# Patient Record
Sex: Female | Born: 1990 | Race: Black or African American | Hispanic: No | Marital: Single | Smoking: Never smoker
Health system: Southern US, Community
[De-identification: ages and names within clinical notes are randomized; demographics above are authoritative.]

## PROBLEM LIST (undated history)

## (undated) DIAGNOSIS — M419 Scoliosis, unspecified: Secondary | ICD-10-CM

## (undated) DIAGNOSIS — B20 Human immunodeficiency virus [HIV] disease: Secondary | ICD-10-CM

## (undated) DIAGNOSIS — Z21 Asymptomatic human immunodeficiency virus [HIV] infection status: Secondary | ICD-10-CM

## (undated) HISTORY — PX: BACK SURGERY: SHX140

---
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
## 2011-04-05 ENCOUNTER — Emergency Department (HOSPITAL_COMMUNITY)
Admission: EM | Admit: 2011-04-05 | Discharge: 2011-04-05 | Disposition: A | Discharge status: Home / Self Care | Payer: Self-pay | Attending: Emergency Medicine | Admitting: Emergency Medicine

## 2011-04-05 ENCOUNTER — Encounter (HOSPITAL_COMMUNITY): Payer: Self-pay | Admitting: Emergency Medicine

## 2011-04-05 DIAGNOSIS — Z7251 High risk heterosexual behavior: Secondary | ICD-10-CM

## 2011-04-05 DIAGNOSIS — R109 Unspecified abdominal pain: Secondary | ICD-10-CM | POA: Insufficient documentation

## 2011-04-05 DIAGNOSIS — Z3202 Encounter for pregnancy test, result negative: Secondary | ICD-10-CM | POA: Insufficient documentation

## 2011-04-05 LAB — URINALYSIS, MICROSCOPIC ONLY
Glucose, UA: NEGATIVE mg/dL
Hgb urine dipstick: NEGATIVE
Ketones, ur: NEGATIVE mg/dL
Protein, ur: NEGATIVE mg/dL

## 2011-04-05 LAB — PREGNANCY, URINE: Preg Test, Ur: NEGATIVE

## 2011-04-05 NOTE — ED Provider Notes (Signed)
History     CSN: [id number]  Arrival date & time 04/05/11  1629   First MD Initiated Contact with Patient 04/05/11 1728      Chief Complaint  Patient presents with  . Abdominal Pain     Patient is a 21 y.o. female presenting with abdominal pain. The history is provided by the patient.  Abdominal Pain The primary symptoms of the illness include abdominal pain. The primary symptoms of the illness do not include fever, vomiting or vaginal bleeding. The current episode started yesterday. The onset of the illness was gradual. The problem has not changed since onset. pt had consensual unprotected sexual intercourse last night She has mild abd pain She is requesting pregnancy test as she thinks she is pregnant  PMH - none  History reviewed. No pertinent past surgical history.  No family history on file.  History  Substance Use Topics  . Smoking status: Never Smoker   . Smokeless tobacco: Not on file  . Alcohol Use: No    OB History    Grav Para Term Preterm Abortions TAB SAB Ect Mult Living                  Review of Systems  Constitutional: Negative for fever.  Gastrointestinal: Positive for abdominal pain. Negative for vomiting.  Genitourinary: Negative for vaginal bleeding.    Allergies  Review of patient's allergies indicates no known allergies.  Home Medications  No current outpatient prescriptions on file.  LMP 03/21/2011  Physical Exam CONSTITUTIONAL: Well developed/well nourished HEAD AND FACE: Normocephalic/atraumatic EYES: EOMI/PERRL ENMT: Mucous membranes moist NECK: supple no meningeal signs CV: S1/S2 noted, no murmurs/rubs/gallops noted LUNGS: Lungs are clear to auscultation bilaterally, no apparent distress ABDOMEN: soft, nontender, no rebound or guarding GU:no cva tenderness NEURO: Pt is awake/alert, moves all extremitiesx4 EXTREMITIES: pulses normal, full ROM SKIN: warm, color normal PSYCH: no abnormalities of mood noted  ED Course    Procedures   Labs Reviewed  URINALYSIS, WITH MICROSCOPIC - Abnormal; Notable for the following:    APPearance CLOUDY (*)    Urobilinogen, UA 2.0 (*)    Leukocytes, UA SMALL (*)    Squamous Epithelial / LPF MANY (*)    All other components within normal limits  PREGNANCY, URINE     1. Unprotected sexual intercourse     Pt advised that initial pregnancy test is negative but would need repeat preg test if she has missed menstrual cycle  MDM  Nursing notes reviewed and considered in documentation All labs/vitals reviewed and considered         Joya Gaskins, MD 04/05/11 [phone]

## 2011-04-05 NOTE — ED Notes (Signed)
Pt states she is here to find out if she is pregnant from intercourse she had last night.  Reports generalized abd pain since this morning.

## 2011-04-09 ENCOUNTER — Encounter (HOSPITAL_COMMUNITY): Payer: Self-pay | Admitting: Emergency Medicine

## 2011-04-09 ENCOUNTER — Emergency Department (HOSPITAL_COMMUNITY)
Admission: EM | Admit: 2011-04-09 | Discharge: 2011-04-10 | Disposition: A | Discharge status: Home / Self Care | Payer: Self-pay | Attending: Emergency Medicine | Admitting: Emergency Medicine

## 2011-04-09 DIAGNOSIS — L0231 Cutaneous abscess of buttock: Secondary | ICD-10-CM | POA: Insufficient documentation

## 2011-04-09 DIAGNOSIS — L03317 Cellulitis of buttock: Secondary | ICD-10-CM | POA: Insufficient documentation

## 2011-04-09 DIAGNOSIS — IMO0001 Reserved for inherently not codable concepts without codable children: Secondary | ICD-10-CM | POA: Insufficient documentation

## 2011-04-09 NOTE — ED Notes (Signed)
Pt c/o abscess at vaginal area x's 2 days with no drainage

## 2011-04-09 NOTE — ED Provider Notes (Signed)
History     CSN: [id number]  Arrival date & time 04/09/11  2148   First MD Initiated Contact with Patient 04/09/11 2305      Chief Complaint  Patient presents with  . Abscess     HPI  History provided by the patient. Patient is a 21 year old female with no significant past medical history presents with complaints of a boil on her right buttocks and perineal area for the past 2 days. She denies having any bleeding or drainage. Symptoms have come on gradually with increasing pain. Pain is a sharp and soreness. Patient has not done anything to help treat her symptoms. Pain is aggravated by pressure. She reports having history of previous boil to her upper buttocks in the past. Patient denies any fever, chills, sweats.   History reviewed. No pertinent past medical history.  Past Surgical History  Procedure Date  . Back surgery     No family history on file.  History  Substance Use Topics  . Smoking status: Never Smoker   . Smokeless tobacco: Not on file  . Alcohol Use: No    OB History    Grav Para Term Preterm Abortions TAB SAB Ect Mult Living                  Review of Systems  Constitutional: Negative for fever and chills.  All other systems reviewed and are negative.    Allergies  Review of patient's allergies indicates no known allergies.  Home Medications  No current outpatient prescriptions on file.  BP 116/72  Pulse 80  Temp(Src) 98.3 F (36.8 C) (Oral)  Resp 16  SpO2 100%  LMP 03/21/2011  Physical Exam  Nursing note and vitals reviewed. Constitutional: She is oriented to person, place, and time. She appears well-developed and well-nourished. No distress.  HENT:  Head: Normocephalic.  Cardiovascular: Normal rate and regular rhythm.   Pulmonary/Chest: Effort normal and breath sounds normal. No respiratory distress.  Neurological: She is alert and oriented to person, place, and time.  Skin: Skin is warm and dry. No rash noted.       4 cm area of  induration with slight fluctuance to the inferior medial aspect of right buttocks and perineal area. No active bleeding or drainage. Area tender to palpation.  Psychiatric: She has a normal mood and affect. Her behavior is normal.    ED Course  Procedures   INCISION AND DRAINAGE Performed by: Angus Seller Consent: Verbal consent obtained. Risks and benefits: risks, benefits and alternatives were discussed Type: abscess  Body area: Right inferior buttock  Anesthesia: local infiltration  Local anesthetic: lidocaine 2% with epinephrine  Anesthetic total: 7 ml  Complexity: complex Blunt dissection to break up loculations  Drainage: purulent  Drainage amount: Moderate   Packing material: 1/4 in iodoform gauze  Patient tolerance: Patient tolerated the procedure well with no immediate complications.      1. Abscess of right buttock       MDM  11:40 PM patient seen and evaluated. Patient no acute distress.        Angus Seller, [place] 04/10/11 [phone]

## 2011-04-10 MED ORDER — DOXYCYCLINE HYCLATE 100 MG PO CAPS: 100.0000 mg | ORAL_CAPSULE | Freq: Two times a day (BID) | ORAL | Status: DC

## 2011-04-10 NOTE — ED Provider Notes (Signed)
Medical screening examination/treatment/procedure(s) were performed by non-physician practitioner and as supervising physician I was immediately available for consultation/collaboration.  Munirah Doerner P Lanyla Costello, MD 04/10/11 0240 

## 2011-04-10 NOTE — ED Notes (Signed)
Pt called to say she is unable to take the capsules d/t inability to swallow them.  Katrina Gomez gave me a V.O. (RB&V) for doxycycline 100mg /34ml susp PO BID x 7 days.  No refills. Called into [facility] at [phone]. Pt informed to pick up new Rx of liquid doxycyline at [facility] and take her insurance information with her.

## 2011-04-11 ENCOUNTER — Emergency Department (HOSPITAL_COMMUNITY)
Admission: EM | Admit: 2011-04-11 | Discharge: 2011-04-11 | Discharge status: Against Medical Advice | Payer: Self-pay | Attending: Emergency Medicine | Admitting: Emergency Medicine

## 2011-04-11 ENCOUNTER — Encounter (HOSPITAL_COMMUNITY): Payer: Self-pay | Admitting: *Deleted

## 2011-04-11 ENCOUNTER — Emergency Department (HOSPITAL_COMMUNITY)
Admission: EM | Admit: 2011-04-11 | Discharge: 2011-04-11 | Disposition: A | Discharge status: Home / Self Care | Payer: Self-pay | Attending: Emergency Medicine | Admitting: Emergency Medicine

## 2011-04-11 DIAGNOSIS — Z09 Encounter for follow-up examination after completed treatment for conditions other than malignant neoplasm: Secondary | ICD-10-CM | POA: Insufficient documentation

## 2011-04-11 DIAGNOSIS — Z4800 Encounter for change or removal of nonsurgical wound dressing: Secondary | ICD-10-CM | POA: Insufficient documentation

## 2011-04-11 DIAGNOSIS — L0231 Cutaneous abscess of buttock: Secondary | ICD-10-CM | POA: Insufficient documentation

## 2011-04-11 DIAGNOSIS — L03317 Cellulitis of buttock: Secondary | ICD-10-CM | POA: Insufficient documentation

## 2011-04-11 NOTE — ED Notes (Signed)
Reports having abscess on buttock drained Thursday, told to return today for recheck.

## 2011-04-11 NOTE — ED Provider Notes (Signed)
History     CSN: [id number]  Arrival date & time 04/11/11  1730   First MD Initiated Contact with Patient 04/11/11 1823      Chief Complaint  Patient presents with  . Wound Check    (Consider location/radiation/quality/duration/timing/severity/associated sxs/prior treatment) HPI  Patient presents to ER requesting wound recheck from I&D 3 days ago. Patient states the packing fell out yesterday. Overall states pain has improved without complaint stating "it is healing well." denies fevers, chills, purulent drainage, pain with defecation, increasing redness or swelling. Denies aggravating or alleviating factors.   History reviewed. No pertinent past medical history.  Past Surgical History  Procedure Date  . Back surgery     History reviewed. No pertinent family history.  History  Substance Use Topics  . Smoking status: Never Smoker   . Smokeless tobacco: Not on file  . Alcohol Use: No    OB History    Grav Para Term Preterm Abortions TAB SAB Ect Mult Living                  Review of Systems  All other systems reviewed and are negative.    Allergies  Review of patient's allergies indicates no known allergies.  Home Medications  No current outpatient prescriptions on file.  BP 109/64  Pulse 69  Temp(Src) 97.8 F (36.6 C) (Oral)  Resp 14  SpO2 100%  LMP 03/21/2011  Physical Exam  Nursing note and vitals reviewed. Constitutional: She is oriented to person, place, and time. She appears well-developed and well-nourished.  HENT:  Head: Normocephalic and atraumatic.  Eyes: Conjunctivae are normal.  Cardiovascular: Normal rate.   Pulmonary/Chest: Effort normal.  Abdominal: Soft. She exhibits no distension. There is no tenderness. There is no rebound.  Musculoskeletal: Normal range of motion.  Neurological: She is alert and oriented to person, place, and time.  Skin:       Well healing linear incision of right inner buttock without erythema, edema or  drainage. No packing in place. No TTP.     ED Course  Procedures (including critical care time)  Wound recheck. No packing in place. Clean dressing applied  Labs Reviewed - No data to display No results found.   1. Abscess packing removal       MDM  Well healing abscess with packing fallen out PTA. No signs or symptoms of cellulitis or worsening infection. Afebrile. No complaints of pain with BM. Pain has improved in general.         Jenness Corner, [place] 04/11/11 [phone]

## 2011-04-11 NOTE — ED Notes (Signed)
Had abscess drained on buttock Thursday, needs wound checked and packing removed.

## 2011-04-11 NOTE — ED Provider Notes (Signed)
Medical screening examination/treatment/procedure(s) were performed by non-physician practitioner and as supervising physician I was immediately available for consultation/collaboration.   Cyndra Numbers, MD 04/11/11 2030

## 2011-04-11 NOTE — ED Notes (Signed)
Never started antibiotics that were prescribed due to $

## 2012-05-11 ENCOUNTER — Emergency Department (HOSPITAL_BASED_OUTPATIENT_CLINIC_OR_DEPARTMENT_OTHER): Payer: Self-pay

## 2012-05-11 ENCOUNTER — Emergency Department (HOSPITAL_BASED_OUTPATIENT_CLINIC_OR_DEPARTMENT_OTHER)
Admission: EM | Admit: 2012-05-11 | Discharge: 2012-05-12 | Disposition: A | Discharge status: Home / Self Care | Payer: Self-pay | Attending: Emergency Medicine | Admitting: Emergency Medicine

## 2012-05-11 ENCOUNTER — Encounter (HOSPITAL_BASED_OUTPATIENT_CLINIC_OR_DEPARTMENT_OTHER): Payer: Self-pay | Admitting: Emergency Medicine

## 2012-05-11 DIAGNOSIS — R42 Dizziness and giddiness: Secondary | ICD-10-CM | POA: Insufficient documentation

## 2012-05-11 DIAGNOSIS — R0602 Shortness of breath: Secondary | ICD-10-CM | POA: Insufficient documentation

## 2012-05-11 DIAGNOSIS — R5383 Other fatigue: Secondary | ICD-10-CM | POA: Insufficient documentation

## 2012-05-11 DIAGNOSIS — R630 Anorexia: Secondary | ICD-10-CM | POA: Insufficient documentation

## 2012-05-11 DIAGNOSIS — Z3202 Encounter for pregnancy test, result negative: Secondary | ICD-10-CM | POA: Insufficient documentation

## 2012-05-11 DIAGNOSIS — R5381 Other malaise: Secondary | ICD-10-CM | POA: Insufficient documentation

## 2012-05-11 DIAGNOSIS — R110 Nausea: Secondary | ICD-10-CM | POA: Insufficient documentation

## 2012-05-11 DIAGNOSIS — R55 Syncope and collapse: Secondary | ICD-10-CM | POA: Insufficient documentation

## 2012-05-11 HISTORY — DX: Scoliosis, unspecified: M41.9

## 2012-05-11 LAB — URINALYSIS, ROUTINE W REFLEX MICROSCOPIC
Bilirubin Urine: NEGATIVE
Nitrite: NEGATIVE
Protein, ur: NEGATIVE mg/dL
Specific Gravity, Urine: 1.027 (ref 1.005–1.030)

## 2012-05-11 LAB — CBC WITH DIFFERENTIAL/PLATELET
Eosinophils Absolute: 0 10*3/uL (ref 0.0–0.7)
Eosinophils Relative: 0 % (ref 0–5)
HCT: 35.7 % — ABNORMAL LOW (ref 36.0–46.0)
Lymphocytes Relative: 17 % (ref 12–46)
Lymphs Abs: 0.8 10*3/uL (ref 0.7–4.0)
MCH: 32.2 pg (ref 26.0–34.0)
MCHC: 35.9 g/dL (ref 30.0–36.0)
Neutro Abs: 3.4 10*3/uL (ref 1.7–7.7)
Neutrophils Relative %: 72 % (ref 43–77)
Platelets: 289 10*3/uL (ref 150–400)

## 2012-05-11 LAB — COMPREHENSIVE METABOLIC PANEL
AST: 21 U/L (ref 0–37)
Albumin: 4.1 g/dL (ref 3.5–5.2)
CO2: 26 mEq/L (ref 19–32)
Calcium: 9.4 mg/dL (ref 8.4–10.5)
Chloride: 103 mEq/L (ref 96–112)
GFR calc non Af Amer: >90 mL/min (ref >90)
Glucose, Bld: 128 mg/dL — ABNORMAL HIGH (ref 70–99)
Sodium: 140 mEq/L (ref 135–145)
Total Bilirubin: 0.3 mg/dL (ref 0.3–1.2)
Total Protein: 7.8 g/dL (ref 6.0–8.3)

## 2012-05-11 LAB — D-DIMER, QUANTITATIVE: D-Dimer, Quant: 0.52 ug/mL-FEU — ABNORMAL HIGH (ref 0.00–0.48)

## 2012-05-11 MED ORDER — IOHEXOL 300 MG/ML  SOLN
80.0000 mL | Freq: Once | INTRAMUSCULAR | Status: AC | PRN
  Administered 2012-05-11: 80 mL via INTRAVENOUS

## 2012-05-11 MED ORDER — SODIUM CHLORIDE 0.9 % IV BOLUS (SEPSIS)
1000.0000 mL | Freq: Once | INTRAVENOUS | Status: AC
  Administered 2012-05-11: 1000 mL via INTRAVENOUS

## 2012-05-11 NOTE — ED Provider Notes (Signed)
History     CSN: [id number]  Arrival date & time 05/11/12  2024   First MD Initiated Contact with Patient 05/11/12 2139      Chief Complaint  Patient presents with  . Shortness of Breath  . Loss of Consciousness    (Consider location/radiation/quality/duration/timing/severity/associated sxs/prior treatment) HPI Comments: Patient states she had a syncopal episode in her car. 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 She left work in which her car where she believes that she passed out. Denies hitting her head or losing consciousness. She drove to her aunt's house in her and drove her here. Denies any chest pain or shortness of breath. Denies abdominal pain, nausea or vomiting. No dysuria hematuria. She is not on birth control. Good by mouth intake and urine output today.  The history is provided by the patient.    Past Medical History  Diagnosis Date  . Scoliosis     Past Surgical History  Procedure Laterality Date  . Back surgery      No family history on file.  History  Substance Use Topics  . Smoking status: Never Smoker   . Smokeless tobacco: Not on file  . Alcohol Use: No    OB History   Grav Para Term Preterm Abortions TAB SAB Ect Mult Living                  Review of Systems  Constitutional: Positive for activity change, appetite change and fatigue. Negative for fever.  HENT: Negative for congestion and rhinorrhea.   Respiratory: Positive for shortness of breath.   Cardiovascular: Positive for syncope. Negative for chest pain.  Gastrointestinal: Positive for nausea. Negative for vomiting and abdominal pain.  Genitourinary: Negative for dysuria and hematuria.  Musculoskeletal: Negative for back pain.  Neurological: Positive for dizziness, syncope, weakness and light-headedness.  A complete 10 system review of systems was obtained and all systems are negative except as noted in the HPI and PMH.    Allergies  Review of  patient's allergies indicates no known allergies.  Home Medications  No current outpatient prescriptions on file.  BP 122/68  Pulse 79  Temp(Src) 97.8 F (36.6 C) (Oral)  Resp 16  Ht 5\' 4"  (1.626 m)  Wt 125 lb (56.7 kg)  BMI 21.45 kg/m2  SpO2 100%  LMP 05/06/2012  Physical Exam  Constitutional: She is oriented to person, place, and time. She appears well-developed and well-nourished. No distress.  HENT:  Head: Normocephalic and atraumatic.  Eyes: Conjunctivae and EOM are normal. Pupils are equal, round, and reactive to light.  Neck: Normal range of motion. Neck supple.  Cardiovascular: Normal rate, regular rhythm and normal heart sounds.   No murmur heard. Pulmonary/Chest: Effort normal and breath sounds normal. No respiratory distress.  Abdominal: Soft. There is no tenderness. There is no rebound and no guarding.  Musculoskeletal: Normal range of motion. She exhibits no edema and no tenderness.  Neurological: She is alert and oriented to person, place, and time. No cranial nerve deficit. She exhibits normal muscle tone. Coordination normal.  Skin: Skin is warm.    ED Course  Procedures (including critical care time)  Labs Reviewed  URINALYSIS, ROUTINE W REFLEX MICROSCOPIC - Abnormal; Notable for the following:    APPearance CLOUDY (*)    Hgb urine dipstick LARGE (*)    Ketones, ur >80 (*)    All other components within normal limits  URINE MICROSCOPIC-ADD ON - Abnormal; Notable for  the following:    Squamous Epithelial / LPF FEW (*)    All other components within normal limits  CBC WITH DIFFERENTIAL - Abnormal; Notable for the following:    HCT 35.7 (*)    All other components within normal limits  COMPREHENSIVE METABOLIC PANEL - Abnormal; Notable for the following:    Glucose, Bld 128 (*)    Alkaline Phosphatase 37 (*)    All other components within normal limits  D-DIMER, QUANTITATIVE - Abnormal; Notable for the following:    D-Dimer, Quant 0.52 (*)    All other  components within normal limits  PREGNANCY, URINE  TROPONIN I   Ct Angio Chest Pe W/cm &/or Wo Cm  05/11/2012  *RADIOLOGY REPORT*  Clinical Data: Shortness of breath, chest pain  CT ANGIOGRAPHY CHEST  Technique:  Multidetector CT imaging of the chest using the standard protocol during bolus administration of intravenous contrast. Multiplanar reconstructed images including MIPs were obtained and reviewed to evaluate the vascular anatomy.  Contrast: 80mL OMNIPAQUE IOHEXOL 300 MG/ML  SOLN  Comparison: None.  Findings: Pulmonary arterial branches are patent.  Normal caliber aorta.  Normal heart size.  No pleural or pericardial effusion.  No intrathoracic lymphadenopathy.  Central airways are patent.  No pneumothorax.  No confluent airspace opacity.  Limited images through the upper abdomen show no acute finding. Images are degraded by streak artifact from the lower thoracic vertebral hardware.  No acute osseous finding.  IMPRESSION: No pulmonary embolism or acute intrathoracic process identified.   Original Report Authenticated By: Jearld Lesch, M.D.    Dg Abd Acute W/chest  05/11/2012  *RADIOLOGY REPORT*  Clinical Data: Syncope, shortness of breath  ACUTE ABDOMEN SERIES (ABDOMEN 2 VIEW & CHEST 1 VIEW)  Comparison: None  Findings: Normal heart size, mediastinal contours, and pulmonary vascularity. Lungs clear. No pleural effusion or pneumothorax. Prior thoracolumbar fixation. Normal bowel gas pattern. No bowel dilatation, bowel wall thickening, or free peritoneal air. No acute osseous findings or urinary tract calcification.  IMPRESSION: No acute abnormalities.   Original Report Authenticated By: Ulyses Southward, M.D.      1. Syncope       MDM  Syncopal episode that sounds vasovagal with flushing, nausea, dizziness and lightheadedness. Vital stable now, no distress. Neurologically intact. HCG negative.  Patient given IV fluids in the ED. EKG shows no arrhythmias.Marland Kitchen He tested negative. She is tolerating  by mouth.  Suspect vasovagal syncope. We'll treat supportively. Return precautions discussed. Ambulatory and tolerating PO.    Date: 05/11/2012  Rate: 74  Rhythm: normal sinus rhythm  QRS Axis: normal  Intervals: normal  ST/T Wave abnormalities: normal  Conduction Disutrbances:none  Narrative Interpretation: no Brugada, no prolonged QT  Old EKG Reviewed: none available     Glynn Octave, MD 05/12/12 0010

## 2012-05-11 NOTE — ED Notes (Signed)
Pt stated that she got very hot at work " she works at [employer] and was assisted out to her car by Radio broadcast assistant, once she got in car she stated she "passed out", reports she felt like she was spinning even with eyes closed, when she  Awoke a few seconds later she drove to her aunts house who brought her here

## 2012-05-11 NOTE — ED Notes (Signed)
Pt sts she got hot and nauseated at work 20 min ago.  Clocked out and went to the car and "passed out" in the car. Does not know how long it lasted.  Becomes SOB when talking.  Sts she does not know if she was SOB before passing out or not.

## 2012-08-16 DIAGNOSIS — Z3009 Encounter for other general counseling and advice on contraception: Secondary | ICD-10-CM | POA: Insufficient documentation

## 2012-10-11 ENCOUNTER — Emergency Department (HOSPITAL_COMMUNITY)
Admission: EM | Admit: 2012-10-11 | Discharge: 2012-10-11 | Disposition: A | Discharge status: Home / Self Care | Payer: No Typology Code available for payment source | Attending: Emergency Medicine | Admitting: Emergency Medicine

## 2012-10-11 ENCOUNTER — Encounter (HOSPITAL_COMMUNITY): Payer: Self-pay | Admitting: *Deleted

## 2012-10-11 DIAGNOSIS — M549 Dorsalgia, unspecified: Secondary | ICD-10-CM

## 2012-10-11 DIAGNOSIS — Z9889 Other specified postprocedural states: Secondary | ICD-10-CM | POA: Insufficient documentation

## 2012-10-11 DIAGNOSIS — IMO0002 Reserved for concepts with insufficient information to code with codable children: Secondary | ICD-10-CM | POA: Insufficient documentation

## 2012-10-11 DIAGNOSIS — Y92410 Unspecified street and highway as the place of occurrence of the external cause: Secondary | ICD-10-CM | POA: Insufficient documentation

## 2012-10-11 DIAGNOSIS — Z8739 Personal history of other diseases of the musculoskeletal system and connective tissue: Secondary | ICD-10-CM | POA: Insufficient documentation

## 2012-10-11 DIAGNOSIS — R519 Headache, unspecified: Secondary | ICD-10-CM

## 2012-10-11 DIAGNOSIS — Y9389 Activity, other specified: Secondary | ICD-10-CM | POA: Insufficient documentation

## 2012-10-11 DIAGNOSIS — S0990XA Unspecified injury of head, initial encounter: Secondary | ICD-10-CM | POA: Insufficient documentation

## 2012-10-11 MED ORDER — TRAMADOL HCL 50 MG PO TABS: 50.0000 mg | ORAL_TABLET | Freq: Four times a day (QID) | ORAL | Status: DC | PRN

## 2012-10-11 MED ORDER — METHOCARBAMOL 500 MG PO TABS: 500.0000 mg | ORAL_TABLET | Freq: Two times a day (BID) | ORAL | Status: DC | PRN
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
## 2012-10-12 NOTE — ED Provider Notes (Signed)
Medical screening examination/treatment/procedure(s) were performed by non-physician practitioner and as supervising physician I was immediately available for consultation/collaboration.   Harnoor Kohles M Leeyah Heather, DO 10/12/12 2206 

## 2013-06-06 ENCOUNTER — Emergency Department (HOSPITAL_COMMUNITY)
Admission: EM | Admit: 2013-06-06 | Discharge: 2013-06-06 | Disposition: A | Discharge status: Home / Self Care | Attending: Emergency Medicine | Admitting: Emergency Medicine

## 2013-06-06 ENCOUNTER — Encounter (HOSPITAL_COMMUNITY): Payer: Self-pay | Admitting: Emergency Medicine

## 2013-06-06 DIAGNOSIS — Z8739 Personal history of other diseases of the musculoskeletal system and connective tissue: Secondary | ICD-10-CM | POA: Insufficient documentation

## 2013-06-06 DIAGNOSIS — J029 Acute pharyngitis, unspecified: Secondary | ICD-10-CM

## 2013-06-06 MED ORDER — IBUPROFEN 600 MG PO TABS: 600.0000 mg | ORAL_TABLET | Freq: Four times a day (QID) | ORAL | Status: DC | PRN

## 2013-06-06 MED ORDER — IBUPROFEN 800 MG PO TABS
800.0000 mg | ORAL_TABLET | Freq: Once | ORAL | Status: AC
  Administered 2013-06-06: 800 mg via ORAL
  Filled 2013-06-06: qty 1

## 2013-06-06 MED ORDER — AMOXICILLIN 500 MG PO CAPS: 500.0000 mg | ORAL_CAPSULE | Freq: Three times a day (TID) | ORAL | Status: DC

## 2013-06-06 MED ORDER — PENICILLIN V POTASSIUM 250 MG PO TABS
500.0000 mg | ORAL_TABLET | Freq: Once | ORAL | Status: AC
  Administered 2013-06-06: 500 mg via ORAL
  Filled 2013-06-06: qty 2

## 2013-06-06 NOTE — ED Provider Notes (Signed)
CSN: [id number]     Arrival date & time 06/06/13  1844 History   First MD Initiated Contact with Patient 06/06/13 1944     Chief Complaint  Patient presents with  . Sore Throat     (Consider location/radiation/quality/duration/timing/severity/associated sxs/prior Treatment) Patient is a 23 y.o. female presenting with pharyngitis. The history is provided by the patient.  Sore Throat This is a new problem. The current episode started in the past 7 days. The problem occurs intermittently. The problem has been gradually worsening. Associated symptoms include fatigue, a fever, headaches and a sore throat. Pertinent negatives include no abdominal pain, arthralgias, chest pain, coughing or neck pain. The symptoms are aggravated by swallowing. She has tried nothing for the symptoms. The treatment provided no relief.    Past Medical History  Diagnosis Date  . Scoliosis    Past Surgical History  Procedure Laterality Date  . Back surgery     History reviewed. No pertinent family history. History  Substance Use Topics  . Smoking status: Never Smoker   . Smokeless tobacco: Not on file  . Alcohol Use: No   OB History   Grav Para Term Preterm Abortions TAB SAB Ect Mult Living                 Review of Systems  Constitutional: Positive for fever and fatigue. Negative for activity change.       All ROS Neg except as noted in HPI  HENT: Positive for sore throat. Negative for nosebleeds.   Eyes: Negative for photophobia and discharge.  Respiratory: Negative for cough, shortness of breath and wheezing.   Cardiovascular: Negative for chest pain and palpitations.  Gastrointestinal: Negative for abdominal pain and blood in stool.  Genitourinary: Negative for dysuria, frequency and hematuria.  Musculoskeletal: Negative for arthralgias, back pain and neck pain.  Skin: Negative.   Neurological: Positive for headaches. Negative for dizziness, seizures and speech difficulty.   Psychiatric/Behavioral: Negative for hallucinations and confusion.      Allergies  Review of patient's allergies indicates no known allergies.  Home Medications   Current Outpatient Rx  Name  Route  Sig  Dispense  Refill  . methocarbamol (ROBAXIN) 500 MG tablet   Oral   Take 1 tablet (500 mg total) by mouth 2 (two) times daily as needed.   14 tablet   0   . traMADol (ULTRAM) 50 MG tablet   Oral   Take 1 tablet (50 mg total) by mouth every 6 (six) hours as needed for pain.   15 tablet   0    BP 117/80  Pulse 111  Temp(Src) 101.8 F (38.8 C) (Oral)  Resp 16  Ht 5\' 4"  (1.626 m)  Wt 125 lb (56.7 kg)  BMI 21.45 kg/m2  SpO2 100%  LMP 05/30/2013 Physical Exam  Nursing note and vitals reviewed. Constitutional: She is oriented to person, place, and time. She appears well-developed and well-nourished.  Non-toxic appearance.  HENT:  Head: Normocephalic.  Right Ear: Tympanic membrane and external ear normal.  Left Ear: Tympanic membrane and external ear normal.  There is mild to moderate increased redness of the posterior pharynx. The airway is patent. The uvula is in the midline. There is no swelling of the tongue, no swelling under the tongue. Speech is clear and understandable.  Eyes: EOM and lids are normal. Pupils are equal, round, and reactive to light.  Neck: Normal range of motion. Neck supple. Carotid bruit is not present.  Cardiovascular: Normal rate,  regular rhythm, normal heart sounds, intact distal pulses and normal pulses.   Pulmonary/Chest: Breath sounds normal. No respiratory distress.  Abdominal: Soft. Bowel sounds are normal. There is no tenderness. There is no guarding.  Musculoskeletal: Normal range of motion.  Lymphadenopathy:       Head (right side): No submandibular adenopathy present.       Head (left side): No submandibular adenopathy present.    She has no cervical adenopathy.  Neurological: She is alert and oriented to person, place, and time. She  has normal strength. No cranial nerve deficit or sensory deficit.  Skin: Skin is warm and dry. No rash noted.  Psychiatric: She has a normal mood and affect. Her speech is normal.    ED Course  Procedures (including critical care time) Labs Review Labs Reviewed - No data to display Imaging Review No results found.   EKG Interpretation None      MDM Patient presents to the emergency department with complaint of sore throat since April 5. Patient also reports fever and headache. There's been no unusual rash reported. On examination there is no evidence of tonsillar abscess, or airway compromise. There is no unusual rash present.  Plan at this time is for the patient to be treated with ibuprofen every 6 hours, salt water gargles, Amoxil 3 times daily. Patient is advised to use a mask and wash hands frequently until this has resolved.    Final diagnoses:  None    *I have reviewed nursing notes, vital signs, and all appropriate lab and imaging results for this patient.Katrina Gomez**    Katrina Gomez M Katrina Timmons, PA-C 06/06/13 2002

## 2013-06-06 NOTE — ED Notes (Signed)
Pt had vagal response and spit up clear liquid after swallowing water after taking medication. Nad noted. PA notified.

## 2013-06-06 NOTE — ED Notes (Signed)
Pt reporting sore throat since Sunday.  No relief with home interventions.  Also reporting headache.

## 2013-06-06 NOTE — Discharge Instructions (Signed)
Please wash hands frequently. Please use a mask until symptoms have resolved. Please use Amoxil 3 times daily, please use ibuprofen every 6 hours for fever and for pain. Salt water gargles maybe helpful. Pharyngitis Pharyngitis is a sore throat (pharynx). There is redness, pain, and swelling of your throat. HOME CARE   Drink enough fluids to keep your pee (urine) clear or pale yellow.  Only take medicine as told by your doctor.  You may get sick again if you do not take medicine as told. Finish your medicines, even if you start to feel better.  Do not take aspirin.  Rest.  Rinse your mouth (gargle) with salt water ( tsp of salt per 1 qt of water) every 1 2 hours. This will help the pain.  If you are not at risk for choking, you can suck on hard candy or sore throat lozenges. GET HELP IF:  You have large, tender lumps on your neck.  You have a rash.  You cough up green, yellow-brown, or bloody spit. GET HELP RIGHT AWAY IF:   You have a stiff neck.  You drool or cannot swallow liquids.  You throw up (vomit) or are not able to keep medicine or liquids down.  You have very bad pain that does not go away with medicine.  You have problems breathing (not from a stuffy nose). MAKE SURE YOU:   Understand these instructions.  Will watch your condition.  Will get help right away if you are not doing well or get worse. Document Released: 08/05/2007 Document Revised: 12/07/2012 Document Reviewed: 10/24/2012 [facility] Patient Information 2014 [place], [place].

## 2013-06-07 NOTE — ED Provider Notes (Signed)
Medical screening examination/treatment/procedure(s) were performed by non-physician practitioner and as supervising physician I was immediately available for consultation/collaboration.   EKG Interpretation None        Linnae Rasool L Gurtaj Ruz, MD 06/07/13 1541 

## 2013-11-17 ENCOUNTER — Emergency Department (HOSPITAL_COMMUNITY)
Admission: EM | Admit: 2013-11-17 | Discharge: 2013-11-18 | Disposition: A | Discharge status: Home / Self Care | Attending: Emergency Medicine | Admitting: Emergency Medicine

## 2013-11-17 ENCOUNTER — Encounter (HOSPITAL_COMMUNITY): Payer: Self-pay | Admitting: Emergency Medicine

## 2013-11-17 DIAGNOSIS — Z8739 Personal history of other diseases of the musculoskeletal system and connective tissue: Secondary | ICD-10-CM | POA: Insufficient documentation

## 2013-11-17 DIAGNOSIS — J040 Acute laryngitis: Secondary | ICD-10-CM | POA: Insufficient documentation

## 2013-11-17 DIAGNOSIS — Z792 Long term (current) use of antibiotics: Secondary | ICD-10-CM | POA: Insufficient documentation

## 2013-11-17 DIAGNOSIS — J029 Acute pharyngitis, unspecified: Secondary | ICD-10-CM | POA: Insufficient documentation

## 2013-11-17 MED ORDER — IBUPROFEN 100 MG/5ML PO SUSP
600.0000 mg | Freq: Once | ORAL | Status: AC
  Administered 2013-11-18: 600 mg via ORAL
  Filled 2013-11-17: qty 30

## 2013-11-17 MED ORDER — IBUPROFEN 100 MG/5ML PO SUSP: 600.0000 mg | Freq: Three times a day (TID) | ORAL | Status: DC

## 2013-11-17 NOTE — ED Notes (Signed)
Patient c/o sore throat; has not been able to speak for 2 days. Denies fever.

## 2013-11-17 NOTE — Discharge Instructions (Signed)
Laryngitis At the top of your windpipe is your voice box. It is the source of your voice. Inside your voice box are 2 bands of muscles called vocal cords. When you breathe, your vocal cords are relaxed and open so that air can get into the lungs. When you decide to say something, these cords come together and vibrate. The sound from these vibrations goes into your throat and comes out through your mouth as sound. Laryngitis is an inflammation of the vocal cords that causes hoarseness, cough, loss of voice, sore throat, and dry throat. Laryngitis can be temporary (acute) or long-term (chronic). Most cases of acute laryngitis improve with time.Chronic laryngitis lasts for more than 3 weeks. CAUSES Laryngitis can often be related to excessive smoking, talking, or yelling, as well as inhalation of toxic fumes and allergies. Acute laryngitis is usually caused by a viral infection, vocal strain, measles or mumps, or bacterial infections. Chronic laryngitis is usually caused by vocal cord strain, vocal cord injury, postnasal drip, growths on the vocal cords, or acid reflux. SYMPTOMS   Cough.  Sore throat.  Dry throat. RISK FACTORS  Respiratory infections.  Exposure to irritating substances, such as cigarette smoke, excessive amounts of alcohol, stomach acids, and workplace chemicals.  Voice trauma, such as vocal cord injury from shouting or speaking too loud. DIAGNOSIS  Your cargiver will perform a physical exam. During the physical exam, your caregiver will examine your throat. The most common sign of laryngitis is hoarseness. Laryngoscopy may be necessary to confirm the diagnosis of this condition. This procedure allows your caregiver to look into the larynx. HOME CARE INSTRUCTIONS  Drink enough fluids to keep your urine clear or pale yellow.  Rest until you no longer have symptoms or as directed by your caregiver.  Breathe in moist air.  Take all medicine as directed by your  caregiver.  Do not smoke.  Talk as little as possible (this includes whispering).  Write on paper instead of talking until your voice is back to normal.  Follow up with your caregiver if your condition has not improved after 10 days. SEEK MEDICAL CARE IF:   You have trouble breathing.  You cough up blood.  You have persistent fever.  You have increasing pain.  You have difficulty swallowing. MAKE SURE YOU:  Understand these instructions.  Will watch your condition.  Will get help right away if you are not doing well or get worse. Document Released: 02/16/2005 Document Revised: 05/11/2011 Document Reviewed: 04/24/2010 [facility] Patient Information 2015 [place], [place]. This information is not intended to replace advice given to you by your health care provider. Make sure you discuss any questions you have with your health care provider.   Rest your voice as discussed.  Use ibuprofen as prescribed every 6 hours for pain and vocal cord inflammation.  Drink plenty of fluids and rest.

## 2013-11-18 NOTE — ED Provider Notes (Signed)
Medical screening examination/treatment/procedure(s) were performed by non-physician practitioner and as supervising physician I was immediately available for consultation/collaboration.     Geoffery Lyons, MD 11/18/13 [phone]

## 2013-11-18 NOTE — ED Provider Notes (Signed)
CSN: [id number]     Arrival date & time 11/17/13  2312 History   First MD Initiated Contact with Patient 11/17/13 2327     Chief Complaint  Patient presents with  . Sore Throat     (Consider location/radiation/quality/duration/timing/severity/associated sxs/prior Treatment) The history is provided by the patient.   Katrina Gomez is a 23 y.o. female presenting with complaint of sore throat which has progressed to loss of voice 2 days ago.  She describes a one week history of uri type symptoms including nasal congestion, clear rhinorrhea, sore throat, cough and post nasal drip which has improved some,  But now has had persistent loss of voice .  She denies fevers or chills, denies mouth or throat swelling, wheezing, shortness of breath and chest pain.  She has taken no medicines for her symptoms. 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     Past Medical History  Diagnosis Date  . Scoliosis    Past Surgical History  Procedure Laterality Date  . Back surgery     No family history on file. History  Substance Use Topics  . Smoking status: Never Smoker   . Smokeless tobacco: Not on file  . Alcohol Use: No   OB History   Grav Para Term Preterm Abortions TAB SAB Ect Mult Living                 Review of Systems  Constitutional: Negative for fever and chills.  HENT: Positive for congestion, postnasal drip, rhinorrhea, sore throat and voice change. Negative for ear pain, sinus pressure and trouble swallowing.   Eyes: Negative for discharge.  Respiratory: Positive for cough. Negative for shortness of breath, wheezing and stridor.   Cardiovascular: Negative for chest pain.  Gastrointestinal: Negative for abdominal pain.  Genitourinary: Negative.       Allergies  Review of patient's allergies indicates no known allergies.  Home Medications   Prior to Admission medications   Medication Sig Start Date End Date Taking? Authorizing  Provider  amoxicillin (AMOXIL) 500 MG capsule Take 1 capsule (500 mg total) by mouth 3 (three) times daily. 06/06/13   Kathie Dike, PA-C  ibuprofen (ADVIL,MOTRIN) 600 MG tablet Take 1 tablet (600 mg total) by mouth every 6 (six) hours as needed. 06/06/13   Kathie Dike, PA-C  ibuprofen (CHILDRENS IBUPROFEN) 100 MG/5ML suspension Take 30 mLs (600 mg total) by mouth every 8 (eight) hours. 11/17/13   Burgess Amor, PA-C  methocarbamol (ROBAXIN) 500 MG tablet Take 1 tablet (500 mg total) by mouth 2 (two) times daily as needed. 10/11/12   Garlon Hatchet, PA-C  traMADol (ULTRAM) 50 MG tablet Take 1 tablet (50 mg total) by mouth every 6 (six) hours as needed for pain. 10/11/12   Garlon Hatchet, PA-C   BP 116/84  Pulse 64  Temp(Src) 98.2 F (36.8 C) (Oral)  Resp 18  Ht  (1.626 m)  Wt 125 lb (56.7 kg)  BMI 21.45 kg/m2  SpO2 100%  LMP 11/17/2013 Physical Exam  Constitutional: She is oriented to person, place, and time. She appears well-developed and well-nourished.  HENT:  Head: Normocephalic and atraumatic.  Right Ear: Tympanic membrane and ear canal normal.  Left Ear: Tympanic membrane and ear canal normal.  Nose: No mucosal edema or rhinorrhea.  Mouth/Throat: Uvula is midline, oropharynx is clear and moist and mucous membranes are normal. No oropharyngeal exudate, posterior oropharyngeal edema, posterior oropharyngeal  erythema or tonsillar abscesses.  Eyes: Conjunctivae are normal.  Cardiovascular: Normal rate and normal heart sounds.   Pulmonary/Chest: Effort normal. No stridor. No respiratory distress. She has no wheezes. She has no rhonchi. She has no rales.  Abdominal: Soft. There is no tenderness.  Musculoskeletal: Normal range of motion.  Lymphadenopathy:       Head (right side): Tonsillar adenopathy present.       Head (left side): Tonsillar adenopathy present.  Neurological: She is alert and oriented to person, place, and time.  Skin: Skin is warm and dry. No rash noted.   Psychiatric: She has a normal mood and affect.    ED Course  Procedures (including critical care time) Labs Review Labs Reviewed - No data to display  Imaging Review No results found.   EKG Interpretation None      MDM   Final diagnoses:  Acute laryngitis    Pt's exam c/w viral process with laryngitis.  Encouraged voice rest,  Ibuprofen,  Rest, increased fluid intake. Recheck for any worsened sx including sob, wheezing, throat swelling, or sx that do not improve with tx.    The patient appears reasonably screened and/or stabilized for discharge and I doubt any other medical condition or other [facility] requiring further screening, evaluation, or treatment in the ED at this time prior to discharge.     Burgess Amor, PA-C 11/18/13 0010

## 2014-09-13 DIAGNOSIS — Z21 Asymptomatic human immunodeficiency virus [HIV] infection status: Secondary | ICD-10-CM | POA: Insufficient documentation

## 2014-10-03 ENCOUNTER — Telehealth: Payer: Self-pay

## 2014-10-03 NOTE — Telephone Encounter (Signed)
Left message for patient to call the office to schedule new intake for referral from [facility] .   Laurell Josephs, RN

## 2014-10-09 ENCOUNTER — Ambulatory Visit (HOSPITAL_COMMUNITY): Attending: Obstetrics and Gynecology

## 2014-11-23 ENCOUNTER — Ambulatory Visit (HOSPITAL_COMMUNITY): Admission: RE | Admit: 2014-11-23 | Source: Ambulatory Visit

## 2014-12-14 ENCOUNTER — Ambulatory Visit (HOSPITAL_COMMUNITY)
Admission: RE | Admit: 2014-12-14 | Discharge: 2014-12-14 | Disposition: A | Discharge status: Home / Self Care | Payer: BLUE CROSS/BLUE SHIELD | Source: Ambulatory Visit | Attending: Obstetrics and Gynecology | Admitting: Obstetrics and Gynecology

## 2014-12-14 ENCOUNTER — Encounter (HOSPITAL_COMMUNITY): Payer: Self-pay

## 2014-12-14 DIAGNOSIS — Z3169 Encounter for other general counseling and advice on procreation: Secondary | ICD-10-CM | POA: Diagnosis present

## 2014-12-14 DIAGNOSIS — B20 Human immunodeficiency virus [HIV] disease: Secondary | ICD-10-CM | POA: Diagnosis not present

## 2014-12-14 HISTORY — DX: Asymptomatic human immunodeficiency virus (hiv) infection status: Z21

## 2014-12-14 HISTORY — DX: Human immunodeficiency virus (HIV) disease: B20

## 2014-12-14 NOTE — Progress Notes (Signed)
MATERNAL FETAL MEDICINE PRECONCEPTUAL CONSULT  Patient Name: Katrina Gomez Medical Record Number:  [id number] Date of Birth: October 08, 1990 Requesting Physician Name:  Marcelle OverlieMichelle Grewal, MD Date of Service: 12/14/2014  Chief Complaint HIV infection  History of Present Illness Katrina Kaisereandra L Gatton is a 24 y.o. G0 who presents today for a preconceptual consult regarding her HIV infection at the request of Marcelle OverlieMichelle Grewal, MD.  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 Congrove has no history of IV drug use of blood transfusion.  [facility] [place]  She is on HAART, but is uncertain of the exact agent.  She also does not know when or what her last viral load and CD4 count was.  She has not had any AIDS defining illnesses and feel well at this time.  She would like to see an REI and pursue conception via sperm donation.  Review of Systems Pertinent items are noted in HPI.  Patient History OB History  No data available    Past Medical History  Diagnosis Date  . Scoliosis   . HIV infection [facility](HCC)     Past Surgical History  Procedure Laterality Date  . Back surgery      Social History   Social History  . Marital Status: Single    Spouse Name: N/A  . Number of Children: N/A  . Years of Education: N/A   Social History Main Topics  . Smoking status: Never Smoker   . Smokeless tobacco: None  . Alcohol Use: No  . Drug Use: No  . Sexual Activity: Yes    Birth Control/ Protection: None   Other Topics Concern  . None   Social History Narrative    History reviewed. No pertinent family history. In addition, the patient has no family history of mental retardation, birth defects, or genetic diseases.  Physical Examination Vitals - BP 107/65, Pulse 61, Weight 124.2 General appearance - alert, well appearing, and in no distress  Assessment and Recommendations 1.   HIV infection.  Ms. Marylyn IshiharaHerbin continue her current treatment and follow closely with her ID specialist.  As she does not know what HAART agents she is on, and I do not have access to her ID records, I cannot assess the fetal safety of her current regimen with one caveat.  If one or more agents is associated with congenital anomalies alternative agents should be used.  I recommend she delay conception until her viral load is undetectable.  Mother to child transmission is 1% or less if her viral load remains undetectable throughout pregnancy.  She should continue HAART throughout pregnancy, during which empiric dose increases are typical in the 2nd and 3rd trimesters due to the increased volume of distribution that normally occurs in pregnancy.  After conception we would be happy to see her back and assist with the management of her pregnancy.  I spent 30 minutes with Ms. Marylyn IshiharaHerbin today of which 50% was face-to-face counseling.  [facility].  Please do not hesitate to contact us with questions.   Rema FendtNITSCHE,Catrice Zuleta, MD

## 2014-12-14 NOTE — ED Notes (Signed)
G0, BP107/65, P-61, Wt 124.2lb

## 2014-12-17 ENCOUNTER — Other Ambulatory Visit (HOSPITAL_COMMUNITY): Payer: Self-pay | Admitting: Obstetrics and Gynecology

## 2015-03-05 ENCOUNTER — Encounter (HOSPITAL_COMMUNITY): Payer: Self-pay | Admitting: Emergency Medicine

## 2015-03-05 ENCOUNTER — Emergency Department (HOSPITAL_COMMUNITY)
Admission: EM | Admit: 2015-03-05 | Discharge: 2015-03-05 | Discharge status: Against Medical Advice | Payer: Managed Care, Other (non HMO) | Attending: Emergency Medicine | Admitting: Emergency Medicine

## 2015-03-05 DIAGNOSIS — Z21 Asymptomatic human immunodeficiency virus [HIV] infection status: Secondary | ICD-10-CM | POA: Diagnosis not present

## 2015-03-05 DIAGNOSIS — L02413 Cutaneous abscess of right upper limb: Secondary | ICD-10-CM | POA: Insufficient documentation

## 2015-03-05 NOTE — ED Notes (Signed)
Pt here for right arm axillary abscess with swelling and pain

## 2015-03-05 NOTE — ED Notes (Signed)
Pt stated she did not want to wait any longer and would go home and place a hot rag on her abscess. I advised pt of risks of leaving pt voiced understanding.

## 2015-05-16 DIAGNOSIS — L0231 Cutaneous abscess of buttock: Secondary | ICD-10-CM | POA: Insufficient documentation

## 2015-05-17 ENCOUNTER — Encounter (HOSPITAL_COMMUNITY): Payer: Self-pay

## 2015-05-17 ENCOUNTER — Emergency Department (HOSPITAL_COMMUNITY)
Admission: EM | Admit: 2015-05-17 | Discharge: 2015-05-17 | Disposition: A | Discharge status: Home / Self Care | Payer: Managed Care, Other (non HMO) | Attending: Emergency Medicine | Admitting: Emergency Medicine

## 2015-05-17 ENCOUNTER — Encounter (HOSPITAL_COMMUNITY): Payer: Self-pay | Admitting: Emergency Medicine

## 2015-05-17 DIAGNOSIS — Z21 Asymptomatic human immunodeficiency virus [HIV] infection status: Secondary | ICD-10-CM | POA: Diagnosis not present

## 2015-05-17 DIAGNOSIS — Z79899 Other long term (current) drug therapy: Secondary | ICD-10-CM | POA: Diagnosis not present

## 2015-05-17 DIAGNOSIS — L02215 Cutaneous abscess of perineum: Secondary | ICD-10-CM | POA: Diagnosis present

## 2015-05-17 DIAGNOSIS — L0291 Cutaneous abscess, unspecified: Secondary | ICD-10-CM

## 2015-05-17 MED ORDER — SULFAMETHOXAZOLE-TRIMETHOPRIM 800-160 MG PO TABS: 1.0000 | ORAL_TABLET | Freq: Two times a day (BID) | ORAL | Status: AC

## 2015-05-17 MED ORDER — HYDROCODONE-ACETAMINOPHEN 5-325 MG PO TABS: 1.0000 | ORAL_TABLET | ORAL | Status: DC | PRN

## 2015-05-17 MED ORDER — LIDOCAINE HCL (PF) 1 % IJ SOLN
5.0000 mL | Freq: Once | INTRAMUSCULAR | Status: DC
  Filled 2015-05-17: qty 5

## 2015-05-17 NOTE — Discharge Instructions (Signed)
Abscess °An abscess is an infected area that contains a collection of pus and debris. It can occur in almost any part of the body. An abscess is also known as a furuncle or boil. °CAUSES  °An abscess occurs when tissue gets infected. This can occur from blockage of oil or sweat glands, infection of hair follicles, or a minor injury to the skin. As the body tries to fight the infection, pus collects in the area and creates pressure under the skin. This pressure causes pain. People with weakened immune systems have difficulty fighting infections and get certain abscesses more often.  °SYMPTOMS °Usually an abscess develops on the skin and becomes a painful mass that is red, warm, and tender. If the abscess forms under the skin, you may feel a moveable soft area under the skin. Some abscesses break open (rupture) on their own, but most will continue to get worse without care. The infection can spread deeper into the body and eventually into the bloodstream, causing you to feel ill.  °DIAGNOSIS  °Your caregiver will take your medical history and perform a physical exam. A sample of fluid may also be taken from the abscess to determine what is causing your infection. °TREATMENT  °Your caregiver may prescribe antibiotic medicines to fight the infection. However, taking antibiotics alone usually does not cure an abscess. Your caregiver may need to make a small cut (incision) in the abscess to drain the pus. In some cases, gauze is packed into the abscess to reduce pain and to continue draining the area. °HOME CARE INSTRUCTIONS  °· Only take over-the-counter or prescription medicines for pain, discomfort, or fever as directed by your caregiver. °· If you were prescribed antibiotics, take them as directed. Finish them even if you start to feel better. °· If gauze is used, follow your caregiver's directions for changing the gauze. °· To avoid spreading the infection: °· Keep your draining abscess covered with a  bandage. °· Wash your hands well. °· Do not share personal care items, towels, or whirlpools with others. °· Avoid skin contact with others. °· Keep your skin and clothes clean around the abscess. °· Keep all follow-up appointments as directed by your caregiver. °SEEK MEDICAL CARE IF:  °· You have increased pain, swelling, redness, fluid drainage, or bleeding. °· You have muscle aches, chills, or a general ill feeling. °· You have a fever. °MAKE SURE YOU:  °· Understand these instructions. °· Will watch your condition. °· Will get help right away if you are not doing well or get worse. °  °This information is not intended to replace advice given to you by your health care provider. Make sure you discuss any questions you have with your health care provider. °  °Document Released: 11/26/2004 Document Revised: 08/18/2011 Document Reviewed: 05/01/2011 °Elsevier Interactive Patient Education ©2016 [employer] ° °Incision and Drainage °Incision and drainage is a procedure in which a sac-like structure (cystic structure) is opened and drained. The area to be drained usually contains material such as pus, fluid, or blood.  °LET YOUR CAREGIVER KNOW ABOUT:  °· Allergies to medicine. °· Medicines taken, including vitamins, herbs, eyedrops, over-the-counter medicines, and creams. °· Use of steroids (by mouth or creams). °· Previous problems with anesthetics or numbing medicines. °· History of bleeding problems or blood clots. °· Previous surgery. °· Other health problems, including diabetes and kidney problems. °· Possibility of pregnancy, if this applies. °RISKS AND COMPLICATIONS °· Pain. °· Bleeding. °· Scarring. °· Infection. °BEFORE THE PROCEDURE  °  You may need to have an ultrasound or other imaging tests to see how large or deep your cystic structure is. Blood tests may also be used to determine if you have an infection or how severe the infection is. You may need to have a tetanus shot. °PROCEDURE  °The affected area  is cleaned with a cleaning fluid. The cyst area will then be numbed with a medicine (local anesthetic). A small incision will be made in the cystic structure. A syringe or catheter may be used to drain the contents of the cystic structure, or the contents may be squeezed out. The area will then be flushed with a cleansing solution. After cleansing the area, it is often gently packed with a gauze or another wound dressing. Once it is packed, it will be covered with gauze and tape or some other type of wound dressing.  °AFTER THE PROCEDURE  °· Often, you will be allowed to go home right after the procedure. °· You may be given antibiotic medicine to prevent or heal an infection. °· If the area was packed with gauze or some other wound dressing, you will likely need to come back in 1 to 2 days to get it removed. °· The area should heal in about 14 days. °  °This information is not intended to replace advice given to you by your health care provider. Make sure you discuss any questions you have with your health care provider. °  °Document Released: 08/12/2000 Document Revised: 08/18/2011 Document Reviewed: 04/13/2011 °Elsevier Interactive Patient Education ©2016 Elsevier Inc. ° °

## 2015-05-17 NOTE — ED Notes (Signed)
Patient c/o abscess to right buttock x2 days. Denies drainage, fever/chills, N/V. No relief with home remedies.

## 2015-05-17 NOTE — ED Notes (Signed)
I have an abscess on my buttocks that has been there for 3 days. Not draining at present time. Hard to sit down.

## 2015-05-18 NOTE — ED Provider Notes (Signed)
CSN: [id number]     Arrival date & time 05/17/15  2104 History   First MD Initiated Contact with Patient 05/17/15 2115     Chief Complaint  Patient presents with  . Abscess     (Consider location/radiation/quality/duration/timing/severity/associated sxs/prior Treatment) Patient is a 25 y.o. female presenting with abscess. The history is provided by the patient.  Abscess Location:  Ano-genital Ano-genital abscess location:  Perineum Size:  2 cm Abscess quality: fluctuance, induration and painful   Abscess quality: not draining and not weeping   Red streaking: no   Duration:  3 days Progression:  Worsening Pain details:    Quality:  Sharp and shooting   Severity:  Moderate (severe with sitting)   Timing:  Constant   Progression:  Worsening Chronicity:  New (has h/o abscesses, new to this site.) Relieved by:  Nothing Ineffective treatments:  Warm water soaks Associated symptoms: no fever, no nausea and no vomiting   Risk factors: no hx of MRSA     Past Medical History  Diagnosis Date  . Scoliosis   . HIV infection [facility])    Past Surgical History  Procedure Laterality Date  . Back surgery     No family history on file. Social History  Substance Use Topics  . Smoking status: Never Smoker   . Smokeless tobacco: None  . Alcohol Use: No   OB History    No data available     Review of Systems  Constitutional: Negative for fever and chills.  HENT: Negative.   Gastrointestinal: Negative for nausea and vomiting.  Genitourinary: Negative.   Skin:       Negative except as mentioned in HPI.    Neurological: Negative for numbness.      Allergies  Review of patient's allergies indicates no known allergies.  Home Medications   Prior to Admission medications   Medication Sig Start Date End Date Taking? Authorizing Provider  abacavir-dolutegravir-lamiVUDine (TRIUMEQ) 600-50-300 MG tablet Take 1 tablet by mouth daily. 04/09/15  Yes Historical Provider, MD   HYDROcodone-acetaminophen (NORCO/VICODIN) 5-325 MG tablet Take 1 tablet by mouth every 4 (four) hours as needed. 05/17/15   Burgess Amor, PA-C  sulfamethoxazole-trimethoprim (BACTRIM DS,SEPTRA DS) 800-160 MG tablet Take 1 tablet by mouth 2 (two) times daily. 05/17/15 05/24/15  Burgess Amor, PA-C   BP 117/73 mmHg  Pulse 79  Temp(Src) 98.2 F (36.8 C) (Oral)  Resp 18  Ht  (1.651 m)  Wt 56.7 kg  BMI 20.80 kg/m2  SpO2 100%  LMP 05/16/2015 (Exact Date) Physical Exam  Constitutional: She is oriented to person, place, and time. She appears well-developed and well-nourished.  HENT:  Head: Normocephalic.  Cardiovascular: Normal rate.   Pulmonary/Chest: Effort normal.  Neurological: She is alert and oriented to person, place, and time. No sensory deficit.  Skin:  2 cm indurated abscess right medial buttock near perineum.  Small area of fluctuance on medial aspect. No pointing, no draining. No surrounding erythema or red streaking, no crepitus.  Rectal exam performed and nontender with no extension to the rectum appreciated.    ED Course  .Marland KitchenIncision and Drainage Date/Time: 05/17/2015 10:15 PM Performed by: Burgess Amor Authorized by: Burgess Amor Consent: Verbal consent obtained. Risks and benefits: risks, benefits and alternatives were discussed Patient understanding: patient states understanding of the procedure being performed Patient identity confirmed: verbally with patient Time out: Immediately prior to procedure a "time out" was called to verify the correct patient, procedure, equipment, support staff and site/side marked as required.  Type: abscess Body area: anogenital Location details: perineum Anesthesia: local infiltration Local anesthetic: lidocaine 1% without epinephrine Patient sedated: no Scalpel size: 11 Incision type: single straight Incision depth: subcutaneous Complexity: simple Drainage: purulent Drainage amount: moderate Wound treatment: wound left open Patient  tolerance: Patient tolerated the procedure well with no immediate complications   (including critical care time) Labs Review Labs Reviewed - No data to display  Imaging Review No results found. I have personally reviewed and evaluated these images and lab results as part of my medical decision-making.   EKG Interpretation None      MDM   Final diagnoses:  Abscess    Advised warm soaks, bactrim and hydrocodone prescribed. Advised prn f/u here or by pcp if sx do not resolve after todays tx.  Post I & D instructions given.  The patient appears reasonably screened and/or stabilized for discharge and I doubt any other medical condition or other [facility] requiring further screening, evaluation, or treatment in the ED at this time prior to discharge.     Burgess AmorJulie Calli Bashor, PA-C 05/18/15 1149  Blane OharaJoshua Zavitz, MD 05/18/15 [phone]

## 2015-08-16 ENCOUNTER — Encounter (HOSPITAL_COMMUNITY): Payer: Self-pay | Admitting: *Deleted

## 2015-08-16 ENCOUNTER — Emergency Department (HOSPITAL_COMMUNITY)
Admission: EM | Admit: 2015-08-16 | Discharge: 2015-08-16 | Disposition: A | Discharge status: Home / Self Care | Payer: Managed Care, Other (non HMO) | Attending: Emergency Medicine | Admitting: Emergency Medicine

## 2015-08-16 ENCOUNTER — Emergency Department (HOSPITAL_COMMUNITY): Payer: Managed Care, Other (non HMO)

## 2015-08-16 DIAGNOSIS — Z21 Asymptomatic human immunodeficiency virus [HIV] infection status: Secondary | ICD-10-CM | POA: Diagnosis not present

## 2015-08-16 DIAGNOSIS — S0083XA Contusion of other part of head, initial encounter: Secondary | ICD-10-CM | POA: Diagnosis present

## 2015-08-16 DIAGNOSIS — Y999 Unspecified external cause status: Secondary | ICD-10-CM | POA: Insufficient documentation

## 2015-08-16 DIAGNOSIS — Y92411 Interstate highway as the place of occurrence of the external cause: Secondary | ICD-10-CM | POA: Diagnosis not present

## 2015-08-16 DIAGNOSIS — Y939 Activity, unspecified: Secondary | ICD-10-CM | POA: Insufficient documentation

## 2015-08-16 DIAGNOSIS — T148XXA Other injury of unspecified body region, initial encounter: Secondary | ICD-10-CM

## 2015-08-16 DIAGNOSIS — Z041 Encounter for examination and observation following transport accident: Secondary | ICD-10-CM

## 2015-08-16 DIAGNOSIS — S060X1A Concussion with loss of consciousness of 30 minutes or less, initial encounter: Secondary | ICD-10-CM | POA: Diagnosis not present

## 2015-08-16 DIAGNOSIS — S5012XA Contusion of left forearm, initial encounter: Secondary | ICD-10-CM | POA: Insufficient documentation

## 2015-08-16 DIAGNOSIS — Z043 Encounter for examination and observation following other accident: Secondary | ICD-10-CM

## 2015-08-16 LAB — COMPREHENSIVE METABOLIC PANEL
ALK PHOS: 34 U/L — AB (ref 38–126)
ALT: 12 U/L — ABNORMAL LOW (ref 14–54)
ANION GAP: 6 (ref 5–15)
AST: 26 U/L (ref 15–41)
Albumin: 3.8 g/dL (ref 3.5–5.0)
BILIRUBIN TOTAL: 0.3 mg/dL (ref 0.3–1.2)
BUN: 8 mg/dL (ref 6–20)
CALCIUM: 8.9 mg/dL (ref 8.9–10.3)
CO2: 23 mmol/L (ref 22–32)
Chloride: 107 mmol/L (ref 101–111)
Creatinine, Ser: 0.84 mg/dL (ref 0.44–1.00)
GFR calc Af Amer: >60 mL/min (ref >60)
GLUCOSE: 99 mg/dL (ref 65–99)
POTASSIUM: 3.1 mmol/L — AB (ref 3.5–5.1)
Sodium: 136 mmol/L (ref 135–145)
TOTAL PROTEIN: 7 g/dL (ref 6.5–8.1)

## 2015-08-16 LAB — CBC WITH DIFFERENTIAL/PLATELET
Basophils Absolute: 0 10*3/uL (ref 0.0–0.1)
Basophils Relative: 0 %
Eosinophils Absolute: 0 10*3/uL (ref 0.0–0.7)
Eosinophils Relative: 1 %
HEMATOCRIT: 35.7 % — AB (ref 36.0–46.0)
HEMOGLOBIN: 12.2 g/dL (ref 12.0–15.0)
Lymphocytes Relative: 14 %
Lymphs Abs: 0.7 10*3/uL (ref 0.7–4.0)
MCH: 32.7 pg (ref 26.0–34.0)
MCHC: 34.2 g/dL (ref 30.0–36.0)
MCV: 95.7 fL (ref 78.0–100.0)
MONOS PCT: 12 %
Monocytes Absolute: 0.6 10*3/uL (ref 0.1–1.0)
NEUTROS ABS: 3.7 10*3/uL (ref 1.7–7.7)
Neutrophils Relative %: 73 %
Platelets: 293 10*3/uL (ref 150–400)
RBC: 3.73 MIL/uL — ABNORMAL LOW (ref 3.87–5.11)
RDW: 12.4 % (ref 11.5–15.5)
WBC: 5 10*3/uL (ref 4.0–10.5)

## 2015-08-16 LAB — I-STAT TROPONIN, ED: Troponin i, poc: 0 ng/mL (ref 0.00–0.08)

## 2015-08-16 LAB — URINALYSIS, ROUTINE W REFLEX MICROSCOPIC
Bilirubin Urine: NEGATIVE
GLUCOSE, UA: NEGATIVE mg/dL
HGB URINE DIPSTICK: NEGATIVE
KETONES UR: 15 mg/dL — AB
LEUKOCYTES UA: NEGATIVE
Nitrite: NEGATIVE
PROTEIN: NEGATIVE mg/dL
Specific Gravity, Urine: 1.021 (ref 1.005–1.030)
pH: 5.5 (ref 5.0–8.0)

## 2015-08-16 LAB — I-STAT CHEM 8, ED
BUN: 9 mg/dL (ref 6–20)
CALCIUM ION: 1.04 mmol/L — AB (ref 1.12–1.23)
CHLORIDE: 106 mmol/L (ref 101–111)
CREATININE: 0.7 mg/dL (ref 0.44–1.00)
Glucose, Bld: 93 mg/dL (ref 65–99)
HCT: 39 % (ref 36.0–46.0)
Hemoglobin: 13.3 g/dL (ref 12.0–15.0)
Potassium: 3.1 mmol/L — ABNORMAL LOW (ref 3.5–5.1)
Sodium: 140 mmol/L (ref 135–145)
TCO2: 21 mmol/L (ref 0–100)

## 2015-08-16 LAB — I-STAT CG4 LACTIC ACID, ED: LACTIC ACID, VENOUS: 1.13 mmol/L (ref 0.5–2.0)

## 2015-08-16 LAB — I-STAT BETA HCG BLOOD, ED (MC, WL, AP ONLY): I-stat hCG, quantitative: <5 m[IU]/mL (ref <5)

## 2015-08-16 MED ORDER — IOPAMIDOL (ISOVUE-300) INJECTION 61%
INTRAVENOUS | Status: AC
  Administered 2015-08-16: 100 mL via INTRAVENOUS
  Filled 2015-08-16: qty 100

## 2015-08-16 MED ORDER — SODIUM CHLORIDE 0.9 % IV SOLN: INTRAVENOUS | Status: DC

## 2015-08-16 MED ORDER — SODIUM CHLORIDE 0.9 % IV BOLUS (SEPSIS)
1000.0000 mL | Freq: Once | INTRAVENOUS | Status: AC
  Administered 2015-08-16: 1000 mL via INTRAVENOUS

## 2015-08-16 MED ORDER — TRAMADOL HCL 50 MG PO TABS: 50.0000 mg | ORAL_TABLET | Freq: Four times a day (QID) | ORAL | Status: DC | PRN

## 2015-08-16 MED ORDER — CYCLOBENZAPRINE HCL 10 MG PO TABS: 10.0000 mg | ORAL_TABLET | Freq: Two times a day (BID) | ORAL | Status: DC | PRN

## 2015-08-16 MED ORDER — NAPROXEN 375 MG PO TABS: 375.0000 mg | ORAL_TABLET | Freq: Two times a day (BID) | ORAL | Status: DC
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
## 2015-08-16 NOTE — ED Notes (Signed)
In xray
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
## 2015-08-19 ENCOUNTER — Encounter (HOSPITAL_COMMUNITY): Payer: Self-pay | Admitting: Emergency Medicine

## 2015-08-19 ENCOUNTER — Emergency Department (HOSPITAL_COMMUNITY)
Admission: EM | Admit: 2015-08-19 | Discharge: 2015-08-19 | Disposition: A | Discharge status: Home / Self Care | Payer: Managed Care, Other (non HMO) | Attending: Emergency Medicine | Admitting: Emergency Medicine

## 2015-08-19 DIAGNOSIS — L0501 Pilonidal cyst with abscess: Secondary | ICD-10-CM

## 2015-08-19 DIAGNOSIS — Z79899 Other long term (current) drug therapy: Secondary | ICD-10-CM | POA: Diagnosis not present

## 2015-08-19 MED ORDER — LIDOCAINE HCL 1 % IJ SOLN
30.0000 mL | Freq: Once | INTRAMUSCULAR | Status: DC
  Filled 2015-08-19: qty 40

## 2015-08-19 MED ORDER — LIDOCAINE HCL 1 % IJ SOLN
5.0000 mL | Freq: Once | INTRAMUSCULAR | Status: AC
  Administered 2015-08-19: 5 mL
  Filled 2015-08-19: qty 20

## 2015-08-19 MED ORDER — SULFAMETHOXAZOLE-TRIMETHOPRIM 800-160 MG PO TABS: 1.0000 | ORAL_TABLET | Freq: Two times a day (BID) | ORAL | Status: AC

## 2015-08-19 NOTE — Discharge Instructions (Signed)
Incision and Drainage of a Pilonidal Cyst Incision and drainage is a surgical procedure to open and drain a fluid-filled sac that forms around a hair follicle in the tailbone area between your buttocks (pilonidal cyst). You may need this procedure if the cyst becomes painful, swollen, or infected. There are three types of procedures that may be done. The type of procedure you have depends on the size and severity of your infected cyst. The procedure may be:  Incision and drainage with a special type of bandage (wound packing). Packing is used for wounds that are deep or tunnel under the skin.  Marsupialization. In this procedure, the cyst will be opened and kept open. The edges of the incision will be stitched together to make a pocket.  Incision and drainage without wound packing. LET YOUR HEALTH CARE PROVIDER KNOW ABOUT:  Any allergies you have.  All medicines you are taking, including vitamins, herbs, eye drops, creams, and over-the-counter medicines.  Previous problems you or members of your family have had with the use of anesthetics.  Any blood disorders you have.  Previous surgeries you have had.  Medical conditions you have. RISKS AND COMPLICATIONS Generally, this is a safe procedure. However, problems can occur and include:  Infection.  Bleeding.  Having another cyst develop.  Need for more surgery. BEFORE THE PROCEDURE  Ask your health care provider about:  Changing or stopping your regular medicines. This is especially important if you are taking diabetes medicines or blood thinners.  Taking medicines such as aspirin and ibuprofen. These medicines can thin your blood. Do not take these medicines before your procedure if your health care provider tells you not to.  Taking antibiotics before surgery to control the infection.  Do not eat or drink anything for 6-8 hours before the procedure if you are having general anesthesia.  Take a shower the night before the  procedure to clean your buttocks area. Take another shower in the morning before surgery.  Plan to have someone take you home after the procedure. PROCEDURE   You will have an IV tube inserted in a vein in your hand or arm.  You will be given one of the following:  A medicine that numbs the area (local anesthetic).  A medicine that makes you go to sleep (general anesthetic).  You also may be given medicine to help you relax during the procedure (sedative).  You will lie face down on the operating table.  Your buttocks area may be shaved.  Tape may be used to spread your buttocks.  Germ-killing solution (antiseptic) may be used to clean the area. Incision and Drainage With Wound Packing:  Your surgeon will make a surgical cut (incision) over the cyst to open it.  A probe may be used to see if there are tunnels extending away from the cyst under your skin.  Fluid or pus inside the cyst will be drained.  The cyst will be flushed out with a germ-free (sterile) solution.  Packing will be placed into the open cyst. This keeps it open and draining after surgery.  The area will be covered with a bandage (dressing). Marsupialization:  Your surgeon will make a surgical cut (incision) over the cyst to open it.  A probe may be used to see if there are tunnels extending away from the cyst under your skin.  Fluid or pus inside the cyst will be drained.  The cyst will be flushed out with a germ-free (sterile) solution.  The edges of   the incision will be stitched (sutured) to the skin to keep it wide open. The cyst will not be packed.  A rolled-up bandage (dressing) will be taped over the incision. Incision and Drainage Without Packing:  Your surgeon will make a surgical cut (incision) over the cyst to open it.  A probe may be used to see if there are tunnels extending away from the cyst under your skin.  Fluid or pus inside the cyst will be drained.  The cyst will be  flushed out with a germ-free (sterile) solution.  Your surgeon may also remove the tissue around the opened cyst.  The incision then will be closed with stitches (sutures). It will not be left open, and packing will not be used.  A bandage (dressing) will be put over the incision area. AFTER THE PROCEDURE  If you had general anesthesia, you will be taken to a recovery area. Your blood pressure, heart rate, breathing rate, and blood oxygen level will be monitored often until the medicines you were given have worn off.  It is normal to have some pain after this procedure. You may be given pain medicine.  Your IV tube can be taken out after you have recovered and your pain is under control.   This information is not intended to replace advice given to you by your health care provider. Make sure you discuss any questions you have with your health care provider.  Take antibiotics as prescribed. Follow up with your primary care provider in 3-4 days for a wound re-check. Return to the ED if you experience severe worsening of your symptoms, fevers, chills, redness or swelling around your wound.

## 2015-08-19 NOTE — ED Notes (Signed)
Pt states that she has had an abscess on her buttocks x 4 days. Doesn't know if it is draining. Alert and oriented.

## 2015-08-19 NOTE — ED Provider Notes (Signed)
CSN: [id number]     Arrival date & time 08/19/15  1937 History  By signing my name below, I, Phillis Haggis, attest that this documentation has been prepared under the direction and in the presence of [employer], PA-C. Electronically Signed: Phillis Haggis, ED Scribe. 08/19/2015. 9:15 PM.   Chief Complaint  Patient presents with  . Abscess   The history is provided by the patient. No language interpreter was used.  HPI Comments: Katrina Gomez is a 25 y.o. Female with a hx of HIV who presents to the Emergency Department complaining of an abscess to the top of the buttocks onset 4 days ago. She is unsure if it is draining. She reports that it is tender to the touch. She reports hx of the same a few months ago. She denies fever, chills, cough, rhinorrhea, nausea, or vomiting. Pt's next appointment with her infectious disease doctor is July 10th. She states that her CD4 counts have been very low.   Past Medical History  Diagnosis Date  . Scoliosis   . HIV infection [facility])    Past Surgical History  Procedure Laterality Date  . Back surgery     History reviewed. No pertinent family history. Social History  Substance Use Topics  . Smoking status: Never Smoker   . Smokeless tobacco: None  . Alcohol Use: No   OB History    No data available     Review of Systems  Constitutional: Negative for fever and chills.  Gastrointestinal: Negative for nausea and vomiting.  Skin: Positive for wound.  All other systems reviewed and are negative.  Allergies  Review of patient's allergies indicates no known allergies.  Home Medications   Prior to Admission medications   Medication Sig Start Date End Date Taking? Authorizing Provider  abacavir-dolutegravir-lamiVUDine (TRIUMEQ) 600-50-300 MG tablet Take 1 tablet by mouth daily. 04/09/15   Historical Provider, MD  cyclobenzaprine (FLEXERIL) 10 MG tablet Take 1 tablet (10 mg total) by mouth 2 (two) times daily as needed for muscle spasms.  08/16/15   Arthor Captain, PA-C  HYDROcodone-acetaminophen (NORCO/VICODIN) 5-325 MG tablet Take 1 tablet by mouth every 4 (four) hours as needed. Patient not taking: Reported on 08/16/2015 05/17/15   Burgess Amor, PA-C  naproxen (NAPROSYN) 375 MG tablet Take 1 tablet (375 mg total) by mouth 2 (two) times daily. 08/16/15   Arthor Captain, PA-C  traMADol (ULTRAM) 50 MG tablet Take 1 tablet (50 mg total) by mouth every 6 (six) hours as needed. 08/16/15   Abigail Harris, PA-C   BP 119/66 mmHg  Pulse 67  Temp(Src) 97.9 F (36.6 C) (Oral)  Resp 16  SpO2 100%  LMP 08/09/2015 Physical Exam  Constitutional: She is oriented to person, place, and time. She appears well-developed and well-nourished. No distress.  HENT:  Head: Normocephalic and atraumatic.  Eyes: Conjunctivae and EOM are normal. Pupils are equal, round, and reactive to light. Right eye exhibits no discharge. Left eye exhibits no discharge. No scleral icterus.  Cardiovascular: Normal rate, regular rhythm, normal heart sounds and intact distal pulses.   Pulmonary/Chest: Effort normal and breath sounds normal.  Abdominal: Soft.  Musculoskeletal: Normal range of motion. She exhibits no edema.  Neurological: She is alert and oriented to person, place, and time.  Skin: Skin is warm and dry. No rash noted. She is not diaphoretic. No erythema. No pallor.  2cm x 2cm area of erythema and fluctuance at apex of gluteal cleft. No tenderness around rectum.   Psychiatric: She has a  normal mood and affect. Her behavior is normal.  Nursing note and vitals reviewed.   ED Course  Procedures (including critical care time) DIAGNOSTIC STUDIES: Oxygen Saturation is 100% on RA, normal by my interpretation.    COORDINATION OF CARE: 9:15 PM-Discussed treatment plan which includes I&D with pt at bedside and pt agreed to plan.   INCISION AND DRAINAGE PROCEDURE NOTE: Patient identification was confirmed and verbal consent was obtained. This procedure was  performed by Gaylyn RongSamantha Lee Kuang, PA-C at 9:57 PM. Site: buttocks Sterile procedures observed Needle size: 25 Anesthetic used (type and amt): lidocaine 1% w/o epi Blade size: 11 Drainage: copious Complexity: Complex Packing used: none Site anesthetized, incision made over site, wound drained and explored loculations, rinsed with copious amounts of normal saline, wound packed with sterile gauze, covered with dry, sterile dressing.  Pt tolerated procedure well without complications.  Instructions for care discussed verbally and pt provided with additional written instructions for homecare and f/u.  Labs Review Labs Reviewed - No data to display  Imaging Review No results found. I have personally reviewed and evaluated these images and lab results as part of my medical decision-making.   EKG Interpretation None      MDM   Final diagnoses:  Pilonidal abscess    Patient with skin abscess amenable to incision and drainage.  Abscess was not large enough to warrant packing or drain,  wound recheck in 2 days. Encouraged home warm soaks and flushing.  Mild signs of cellulitis is surrounding skin.  Will d/c to home.  Pt given bactrim as she immunocompromised with HIV. No signs of sepsis.   I personally performed the services described in this documentation, which was scribed in my presence. The recorded information has been reviewed and is accurate.     Lester KinsmanSamantha Tripp [place], PA-C 08/21/15 [id number]  Pricilla LovelessScott Goldston, MD 08/22/15 [phone]

## 2016-04-02 DIAGNOSIS — K0510 Chronic gingivitis, plaque induced: Secondary | ICD-10-CM | POA: Insufficient documentation
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
## 2018-05-01 ENCOUNTER — Encounter (HOSPITAL_COMMUNITY): Payer: Self-pay | Admitting: Emergency Medicine

## 2018-05-01 ENCOUNTER — Emergency Department (HOSPITAL_COMMUNITY)
Admission: EM | Admit: 2018-05-01 | Discharge: 2018-05-01 | Disposition: A | Discharge status: Home / Self Care | Payer: Managed Care, Other (non HMO) | Attending: Emergency Medicine | Admitting: Emergency Medicine

## 2018-05-01 ENCOUNTER — Other Ambulatory Visit: Payer: Self-pay

## 2018-05-01 ENCOUNTER — Emergency Department (HOSPITAL_COMMUNITY): Payer: Managed Care, Other (non HMO)

## 2018-05-01 DIAGNOSIS — J029 Acute pharyngitis, unspecified: Secondary | ICD-10-CM

## 2018-05-01 DIAGNOSIS — Z79899 Other long term (current) drug therapy: Secondary | ICD-10-CM | POA: Insufficient documentation

## 2018-05-01 DIAGNOSIS — M419 Scoliosis, unspecified: Secondary | ICD-10-CM | POA: Insufficient documentation

## 2018-05-01 DIAGNOSIS — Z21 Asymptomatic human immunodeficiency virus [HIV] infection status: Secondary | ICD-10-CM | POA: Insufficient documentation

## 2018-05-01 LAB — BASIC METABOLIC PANEL
Anion gap: 7 (ref 5–15)
BUN: 10 mg/dL (ref 6–20)
CO2: 25 mmol/L (ref 22–32)
Calcium: 8.6 mg/dL — ABNORMAL LOW (ref 8.9–10.3)
Chloride: 102 mmol/L (ref 98–111)
Creatinine, Ser: 0.79 mg/dL (ref 0.44–1.00)
GFR calc Af Amer: >60 mL/min (ref >60)
GFR calc non Af Amer: >60 mL/min (ref >60)
GLUCOSE: 105 mg/dL — AB (ref 70–99)
Potassium: 4 mmol/L (ref 3.5–5.1)
Sodium: 134 mmol/L — ABNORMAL LOW (ref 135–145)

## 2018-05-01 LAB — I-STAT BETA HCG BLOOD, ED (MC, WL, AP ONLY): I-stat hCG, quantitative: <5 m[IU]/mL (ref <5)

## 2018-05-01 LAB — CBC WITH DIFFERENTIAL/PLATELET
Abs Immature Granulocytes: 0.02 10*3/uL (ref 0.00–0.07)
BASOS PCT: 1 %
Basophils Absolute: 0 10*3/uL (ref 0.0–0.1)
Eosinophils Absolute: 0 10*3/uL (ref 0.0–0.5)
Eosinophils Relative: 1 %
HCT: 38 % (ref 36.0–46.0)
Hemoglobin: 12.8 g/dL (ref 12.0–15.0)
Immature Granulocytes: 0 %
Lymphocytes Relative: 26 %
Lymphs Abs: 2.2 10*3/uL (ref 0.7–4.0)
MCH: 33.1 pg (ref 26.0–34.0)
MCHC: 33.7 g/dL (ref 30.0–36.0)
MCV: 98.2 fL (ref 80.0–100.0)
MONOS PCT: 14 %
Monocytes Absolute: 1.2 10*3/uL — ABNORMAL HIGH (ref 0.1–1.0)
Neutro Abs: 5.1 10*3/uL (ref 1.7–7.7)
Neutrophils Relative %: 58 %
Platelets: 276 10*3/uL (ref 150–400)
RBC: 3.87 MIL/uL (ref 3.87–5.11)
RDW: 13.1 % (ref 11.5–15.5)
WBC: 8.6 10*3/uL (ref 4.0–10.5)
nRBC: 0 % (ref 0.0–0.2)

## 2018-05-01 LAB — INFLUENZA PANEL BY PCR (TYPE A & B)
Influenza A By PCR: NEGATIVE
Influenza B By PCR: NEGATIVE

## 2018-05-01 LAB — GROUP A STREP BY PCR: Group A Strep by PCR: NOT DETECTED

## 2018-05-01 MED ORDER — CHLORHEXIDINE GLUCONATE 0.12 % MT SOLN: 15.0000 mL | Freq: Two times a day (BID) | OROMUCOSAL | 0 refills | Status: AC

## 2018-05-01 NOTE — ED Provider Notes (Signed)
[facility]-EMERGENCY DEPT Provider Note   CSN: [id number] Arrival date & time: 05/01/18  1916    History   Chief Complaint Chief Complaint  Patient presents with  . Sore Throat  . Influenza    HPI Katrina Gomez is a 28 y.o. female.     HPI  Patient is a 28 year old female with a history of HIV (currently on Biktarvy, followed by [facility] with most recent lab work November 2019 showing undetectable viral load per patient report, and unknown CD4 count but CD4 430 in October 2018) presenting for sore throat.  Patient reports that she began having a sore throat 2 days ago.  She denies any voice change, difficulty breathing, but it is painful to swallow.  She reports that she had a "cold" 2 weeks ago from which she was recovering until the last 48 hours.  No chest pain, shortness of breath, nausea, vomiting, or diarrhea.  No rashes.  She reports sick contacts in her nieces and nephews.  Patient reports that she continues to have some mild rhinorrhea as well as a cough productive of green to yellow sputum.  She felt feverish yesterday, and has not taken any antipyretics this morning.  Patient has tried TheraFlu, and lozenges for her symptoms.  Past Medical History:  Diagnosis Date  . HIV infection (HCC)   . Scoliosis     There are no active problems to display for this patient.   Past Surgical History:  Procedure Laterality Date  . BACK SURGERY       OB History   No obstetric history on file.      Home Medications    Prior to Admission medications   Medication Sig Start Date End Date Taking? Authorizing Provider  abacavir-dolutegravir-lamiVUDine (TRIUMEQ) 600-50-300 MG tablet Take 1 tablet by mouth daily. 04/09/15   [provider]  cyclobenzaprine (FLEXERIL) 10 MG tablet Take 1 tablet (10 mg total) by mouth 2 (two) times daily as needed for muscle spasms. 08/16/15   Arthor Captain, PA-C  HYDROcodone-acetaminophen  (NORCO/VICODIN) 5-325 MG tablet Take 1 tablet by mouth every 4 (four) hours as needed. Patient not taking: Reported on 08/16/2015 05/17/15   Burgess Amor, PA-C  naproxen (NAPROSYN) 375 MG tablet Take 1 tablet (375 mg total) by mouth 2 (two) times daily. 08/16/15   Harris, Cammy Copa, PA-C  traMADol (ULTRAM) 50 MG tablet Take 1 tablet (50 mg total) by mouth every 6 (six) hours as needed. 08/16/15   Arthor Captain, PA-C    Family History History reviewed. No pertinent family history.  Social History Social History   Tobacco Use  . Smoking status: Never Smoker  . Smokeless tobacco: Never Used  Substance Use Topics  . Alcohol use: No  . Drug use: No     Allergies   Patient has no known allergies.   Review of Systems Review of Systems  Constitutional: Negative for chills and fever.  HENT: Positive for congestion, rhinorrhea and sore throat. Negative for trouble swallowing and voice change.   Eyes: Negative for visual disturbance.  Respiratory: Positive for cough. Negative for chest tightness, shortness of breath and stridor.   Cardiovascular: Negative for chest pain.  Gastrointestinal: Negative for abdominal pain, diarrhea, nausea and vomiting.  Genitourinary: Negative for dysuria and flank pain.  Musculoskeletal: Negative for back pain and myalgias.  Skin: Negative for rash.  Neurological: Negative for dizziness, syncope, light-headedness and headaches.     Physical Exam Updated Vital Signs BP 118/76 (BP Location:  Left Arm)   Pulse 99   Temp 98.9 F (37.2 C) (Oral)   Resp 18   Ht  (1.651 m)   Wt 68 kg   LMP 04/11/2018   SpO2 100%   BMI 24.96 kg/m   Physical Exam Vitals signs and nursing note reviewed.  Constitutional:      General: She is not in acute distress.    Appearance: She is well-developed. She is not ill-appearing or diaphoretic.  HENT:     Head: Normocephalic and atraumatic.     Right Ear: Tympanic membrane normal.     Left Ear: Tympanic membrane  normal.     Mouth/Throat:     Comments: Normal phonation. No muffled voice sounds. Patient swallows secretions without difficulty. Dentition normal. No lesions of tongue or buccal mucosa. Uvula midline. No asymmetric swelling of the posterior pharynx.Minimal erythema of posterior pharynx. No tonsillar exuduate. No lingual swelling. No induration inferior to tongue. No submandibular tenderness, swelling, or induration.  Tissues of the neck supple. No cervical lymphadenopathy. Right TM without erythema or effusion; left TM without erythema or effusion.  Eyes:     Conjunctiva/sclera: Conjunctivae normal.     Pupils: Pupils are equal, round, and reactive to light.  Neck:     Musculoskeletal: Normal range of motion and neck supple.  Cardiovascular:     Rate and Rhythm: Normal rate and regular rhythm.     Heart sounds: S1 normal and S2 normal. No murmur.  Pulmonary:     Effort: Pulmonary effort is normal.     Breath sounds: Normal breath sounds. No wheezing or rales.  Abdominal:     General: There is no distension.     Palpations: Abdomen is soft.     Tenderness: There is no abdominal tenderness. There is no guarding.  Musculoskeletal: Normal range of motion.        General: No deformity.  Lymphadenopathy:     Cervical: No cervical adenopathy.  Skin:    General: Skin is warm and dry.     Findings: No erythema or rash.  Neurological:     Mental Status: She is alert.     Comments: Cranial nerves grossly intact. Patient moves extremities symmetrically and with good coordination.  Psychiatric:        Behavior: Behavior normal.        Thought Content: Thought content normal.        Judgment: Judgment normal.      ED Treatments / Results  Labs (all labs ordered are listed, but only abnormal results are displayed) Labs Reviewed  CBC WITH DIFFERENTIAL/PLATELET - Abnormal; Notable for the following components:      Result Value   Monocytes Absolute 1.2 (*)    All other components  within normal limits  BASIC METABOLIC PANEL - Abnormal; Notable for the following components:   Sodium 134 (*)    Glucose, Bld 105 (*)    Calcium 8.6 (*)    All other components within normal limits  GROUP A STREP BY PCR  INFLUENZA PANEL BY PCR (TYPE A & B)  I-STAT BETA HCG BLOOD, ED ([facility], [facility], [facility] ONLY)    EKG None  Radiology Dg Chest 2 View  Result Date: 05/01/2018 CLINICAL DATA:  Cough and sore throat for several days. EXAM: CHEST - 2 VIEW COMPARISON:  08/16/2015 FINDINGS: The heart size and mediastinal contours are within normal limits. Both lungs are clear. Posterior fusion hardware seen in the lower thoracic and lumbar spine. IMPRESSION: No  active cardiopulmonary disease. Electronically Signed   By: Myles Rosenthal M.D.   On: 05/01/2018 07:16    Procedures Procedures (including critical care time)  Medications Ordered in ED Medications - No data to display   Initial Impression / Assessment and Plan / ED Course  I have reviewed the triage vital signs and the nursing notes.  Pertinent labs & imaging results that were available during my care of the patient were reviewed by me and considered in my medical decision making (see chart for details).  Clinical Course as of May 01 735  Sun May 01, 2018  0734 Normal lymphocyte count.  Lymphocytes: 26 [AM]    Clinical Course User Index [AM] Elisha Ponder, PA-C       Patient nontoxic-appearing, afebrile, and hemodynamically stable.  Patient is in no acute distress patient is tolerating p.o. in the emergency department without difficulty.  Do not suspect PTA or RPA.  Differential gnosis includes influenza, strep pharyngitis, viral pharyngitis, laryngitis.  Given patient's unknown no recent lab report for her CD4 count, will check basic lab work to check lymphocyte count in addition to influenza and strep testing.  Work-up demonstrating normal CBC.  BMP without significant electrolyte abnormalities.  Group A strep is negative in  addition to influenza.  Chest radiograph is negative for infiltrate in the setting of normal lung exam.  Suspect other viral pharyngitis.  Feel the patient stable from a immunocompetency standpoint for outpatient follow-up.  Return precautions given for any increasing pain, difficulty swallowing, change in phonation, difficulty breathing, fever or chills, or any new or worsening symptoms.  Patient is in understanding and agrees with the plan of care.  Final Clinical Impressions(s) / ED Diagnoses   Final diagnoses:  Viral pharyngitis    ED Discharge Orders    None       Delia Chimes 05/01/18 0803    Paula Libra, MD 05/03/18 2234

## 2018-05-01 NOTE — Discharge Instructions (Signed)
Please read and follow all provided instructions.  Your diagnoses today include:  1. Viral pharyngitis     You appear to have an upper respiratory infection (URI). An upper respiratory tract infection, or cold, is a viral infection of the air passages leading to the lungs. It should improve gradually after 5-7 days. You may have a lingering cough that lasts for 2- 4 weeks after the infection.  Tests performed today include: Vital signs. See below for your results today.   Medications prescribed:   Take any prescribed medications only as directed. Treatment for your infection is aimed at treating the symptoms. There are no medications, such as antibiotics, that will cure your infection.   Home care instructions:  Follow any educational materials contained in this packet.   Your illness is contagious and can be spread to others, especially during the first 3 or 4 days. It cannot be cured by antibiotics or other medicines. Take basic precautions such as washing your hands often, covering your mouth when you cough or sneeze, and avoiding public places where you could spread your illness to others.   Please continue drinking plenty of fluids.  Use over-the-counter medicines as needed as directed on packaging for symptom relief.  You may also use ibuprofen or tylenol as directed on packaging for pain or fever.  Do not take multiple medicines containing Tylenol or acetaminophen to avoid taking too much of this medication.  Follow-up instructions: Please follow-up with your primary care provider in the next 3 days for further evaluation of your symptoms if you are not feeling better.   Return instructions:  Please return to the Emergency Department if you experience worsening symptoms.  RETURN IMMEDIATELY IF you develop shortness of breath, chest pain, confusion or altered mental status, a new rash, become dizzy, faint, or poorly responsive, or are unable to be cared for at home. Please return if  you have persistent vomiting and cannot keep down fluids or develop a fever that is not controlled by tylenol or motrin.   Please return if you have any other emergent concerns.  Additional Information:  Your vital signs today were: BP 118/76 (BP Location: Left Arm)    Pulse 99    Temp 98.9 F (37.2 C) (Oral)    Resp 18    Ht 5\' 5"  (1.651 m)    Wt 68 kg    LMP 04/11/2018    SpO2 100%    BMI 24.96 kg/m  If your blood pressure (BP) was elevated above 135/85 this visit, please have this repeated by your doctor within one month. --------------

## 2018-05-01 NOTE — ED Triage Notes (Signed)
Patient is complaining of a sore throat, cough, being cold and then hot, and a cold. Patient states she has had a cold for two week. Patient states her throat started hurting on Friday.

## 2019-06-09 ENCOUNTER — Other Ambulatory Visit: Payer: Self-pay | Admitting: Obstetrics & Gynecology

## 2019-06-20 DIAGNOSIS — M419 Scoliosis, unspecified: Secondary | ICD-10-CM | POA: Insufficient documentation

## 2019-10-04 IMAGING — CR DG CHEST 2V
2 series · 2 of 2 positions shown · non-contrast
Comparison: 08/16/2015

CLINICAL DATA: Cough and sore throat for several days.

EXAM:
CHEST - 2 VIEW

[w chest pa]
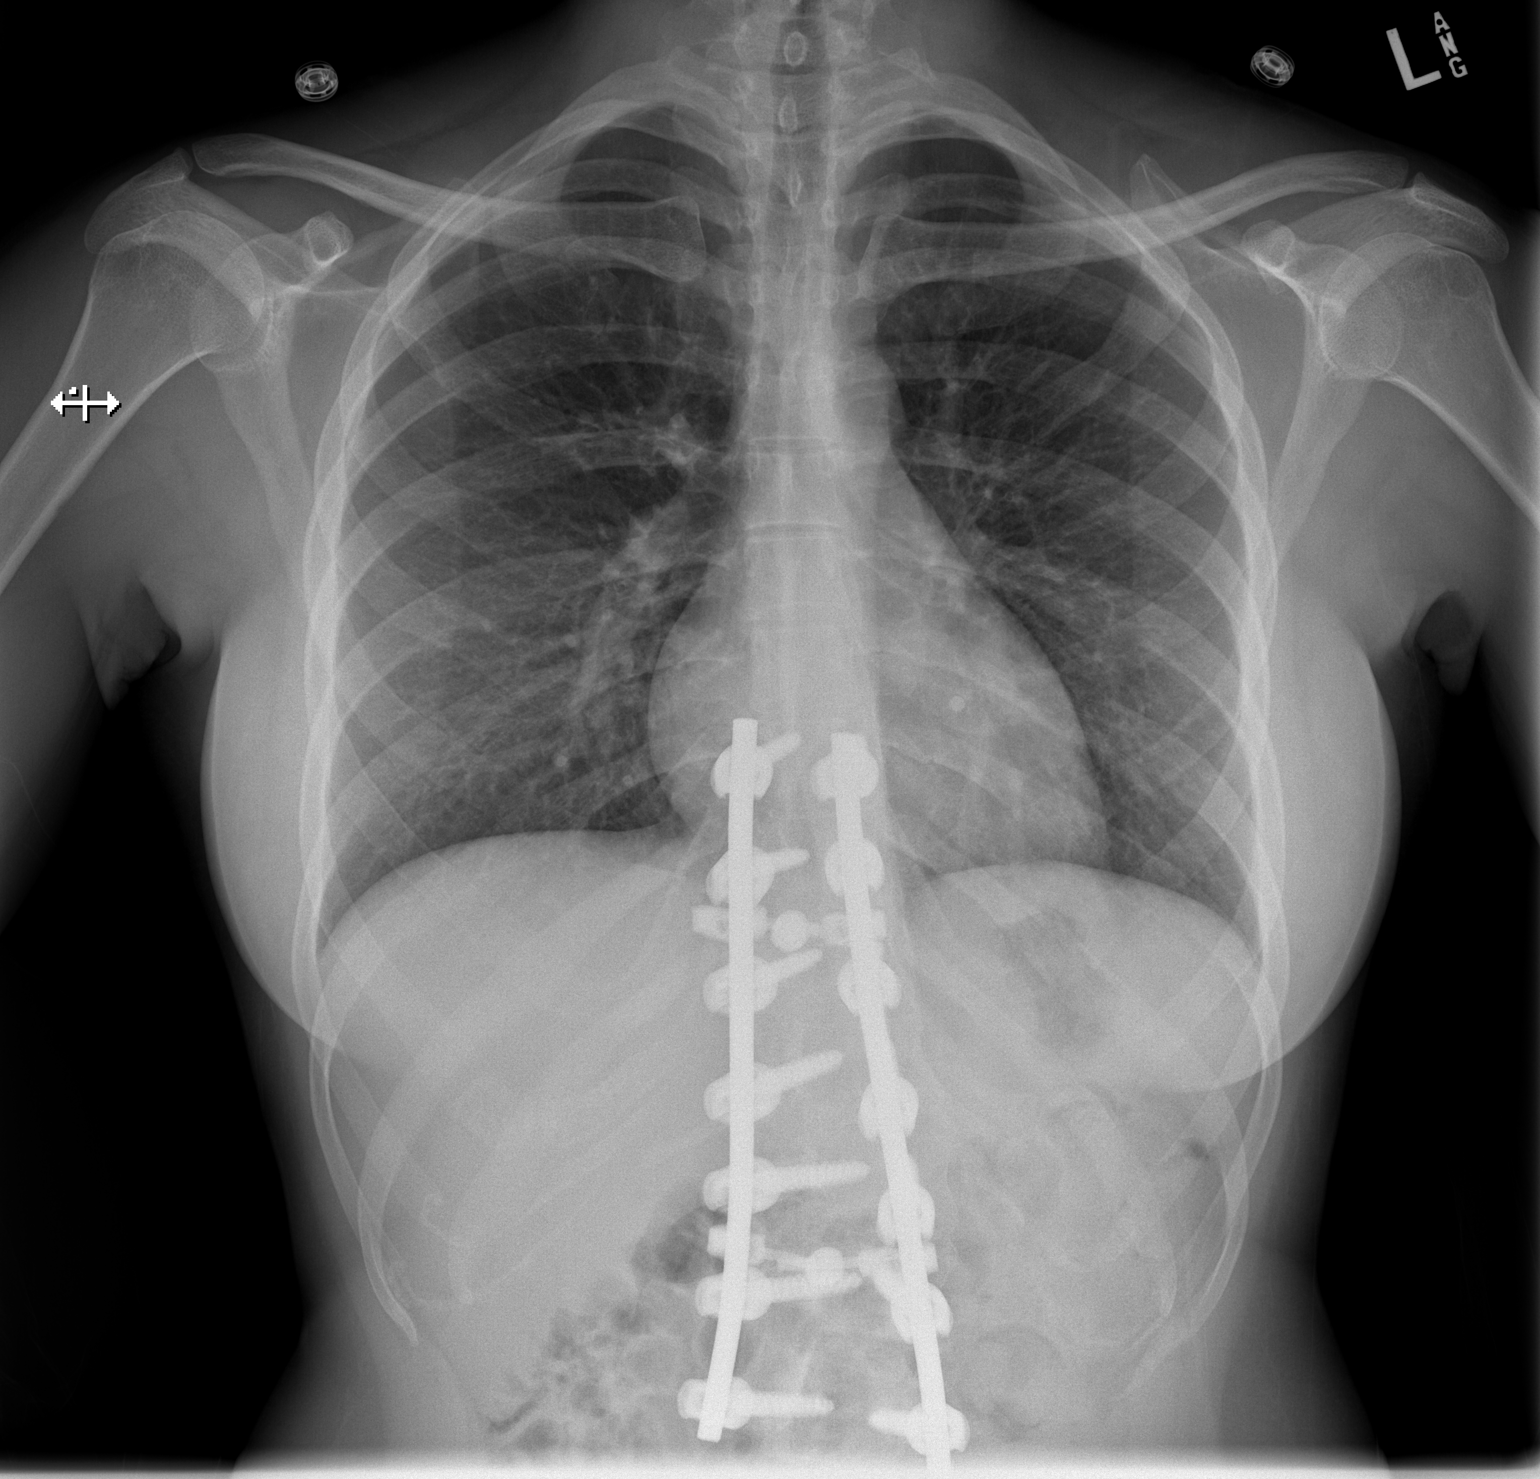

[w chest lat]
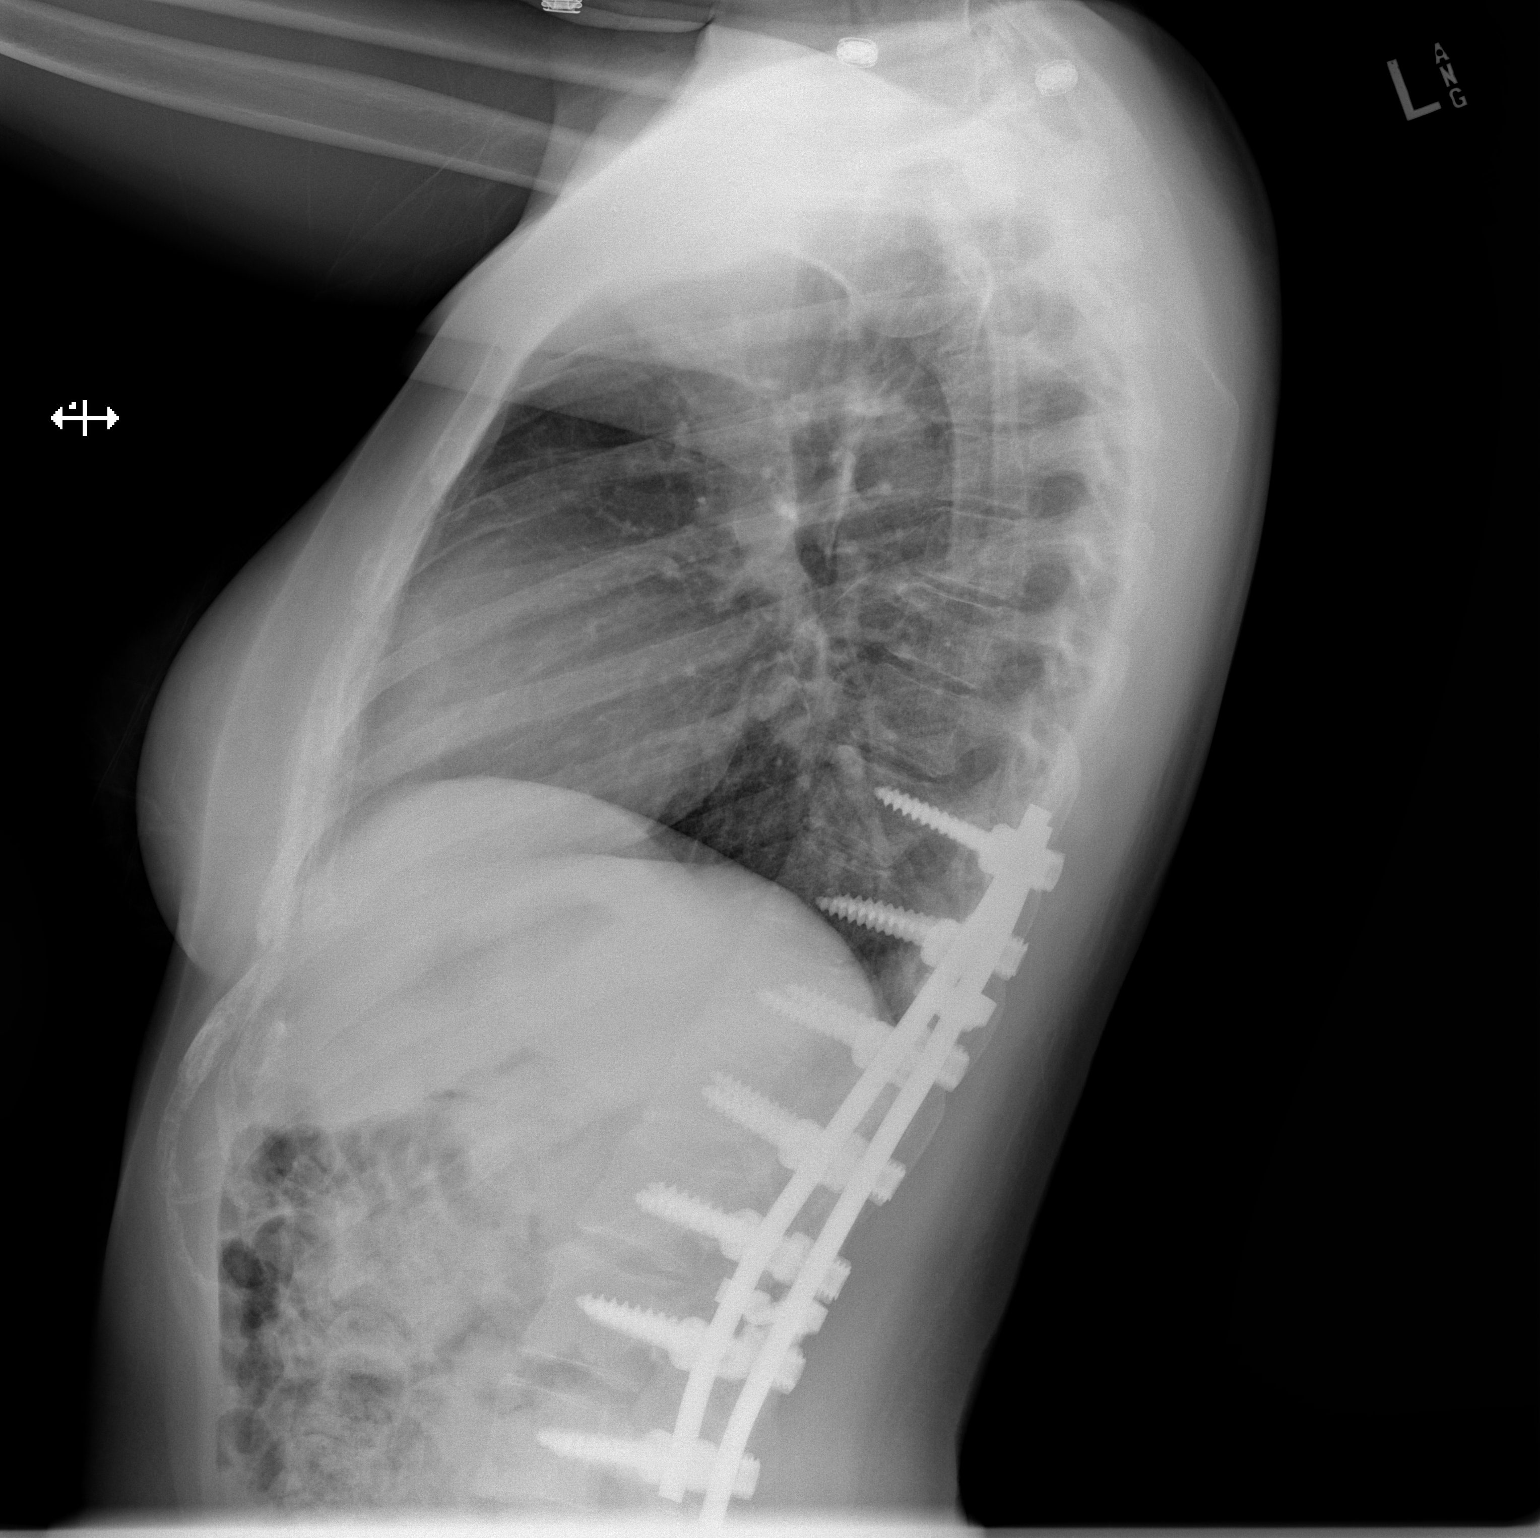

[2 of 2 positions shown; findings below may reference images not displayed]

FINDINGS: The heart size and mediastinal contours are within normal limits.
Both lungs are clear. Posterior fusion hardware seen in the lower
thoracic and lumbar spine.
IMPRESSION: No active cardiopulmonary disease.

## 2019-11-24 ENCOUNTER — Ambulatory Visit: Payer: Self-pay

## 2020-07-07 ENCOUNTER — Emergency Department (HOSPITAL_BASED_OUTPATIENT_CLINIC_OR_DEPARTMENT_OTHER)
Admission: EM | Admit: 2020-07-07 | Discharge: 2020-07-07 | Disposition: A | Discharge status: Home / Self Care | Payer: 59 | Attending: Emergency Medicine | Admitting: Emergency Medicine

## 2020-07-07 ENCOUNTER — Other Ambulatory Visit: Payer: Self-pay

## 2020-07-07 ENCOUNTER — Encounter (HOSPITAL_BASED_OUTPATIENT_CLINIC_OR_DEPARTMENT_OTHER): Payer: Self-pay

## 2020-07-07 DIAGNOSIS — S70362A Insect bite (nonvenomous), left thigh, initial encounter: Secondary | ICD-10-CM | POA: Diagnosis not present

## 2020-07-07 DIAGNOSIS — Z21 Asymptomatic human immunodeficiency virus [HIV] infection status: Secondary | ICD-10-CM | POA: Insufficient documentation

## 2020-07-07 DIAGNOSIS — S79922A Unspecified injury of left thigh, initial encounter: Secondary | ICD-10-CM | POA: Diagnosis present

## 2020-07-07 DIAGNOSIS — W57XXXA Bitten or stung by nonvenomous insect and other nonvenomous arthropods, initial encounter: Secondary | ICD-10-CM | POA: Insufficient documentation

## 2020-07-07 NOTE — ED Notes (Signed)
Pt verbalizes understanding of discharge instructions. Opportunity for questioning and answers were provided. Armand removed by staff, pt discharged from ED to home. Instructed to look at [facility] for results.

## 2020-07-07 NOTE — ED Provider Notes (Signed)
[facility] EMERGENCY DEPARTMENT Provider Note  CSN: [id number] Arrival date & time: 07/07/20 1900    History Chief Complaint  Patient presents with  . Insect Bite    HPI  OPLE GIRGIS is a 30 y.o. female with history of HIV well controlled on meds presents after finding a tick on her L thigh yesterday. She has had some redness and swelling to that area since removing the tick. No fever, neck pain, headache, hand rash or bullseye rash. She is concerned about tick bourne illness.     Past Medical History:  Diagnosis Date  . HIV infection (HCC)   . Scoliosis     Past Surgical History:  Procedure Laterality Date  . BACK SURGERY      No family history on file.  Social History   Tobacco Use  . Smoking status: Never Smoker  . Smokeless tobacco: Never Used  Vaping Use  . Vaping Use: Never used  Substance Use Topics  . Alcohol use: No  . Drug use: No     Home Medications Prior to Admission medications   Medication Sig Start Date End Date Taking? Authorizing Provider  BIKTARVY 50-200-25 MG TABS tablet Take 1 tablet by mouth daily. 03/23/18   [provider]  chlorhexidine (PERIDEX) 0.12 % solution Use as directed 15 mLs in the mouth or throat 2 (two) times daily. Swish and spit.  Do not swallow. 05/01/18   Aviva Kluver B, PA-C  cyclobenzaprine (FLEXERIL) 10 MG tablet Take 1 tablet (10 mg total) by mouth 2 (two) times daily as needed for muscle spasms. Patient not taking: Reported on 05/01/2018 08/16/15   Arthor Captain, PA-C  HYDROcodone-acetaminophen (NORCO/VICODIN) 5-325 MG tablet Take 1 tablet by mouth every 4 (four) hours as needed. Patient not taking: Reported on 08/16/2015 05/17/15   Burgess Amor, PA-C  naproxen (NAPROSYN) 375 MG tablet Take 1 tablet (375 mg total) by mouth 2 (two) times daily. Patient not taking: Reported on 05/01/2018 08/16/15   Arthor Captain, PA-C  pseudoephedrine-acetaminophen (TYLENOL SINUS) 30-500 MG TABS tablet Take 1  tablet by mouth every 4 (four) hours as needed (cold symptoms).    [provider]  traMADol (ULTRAM) 50 MG tablet Take 1 tablet (50 mg total) by mouth every 6 (six) hours as needed. Patient not taking: Reported on 05/01/2018 08/16/15   Arthor Captain, PA-C     Allergies    Patient has no known allergies.   Review of Systems   Review of Systems A comprehensive review of systems was completed and negative except as noted in HPI.    Physical Exam BP 117/74 (BP Location: Left Arm)   Pulse 87   Temp 98.6 F (37 C) (Oral)   Resp 20   Ht 5\' 6"  (1.676 m)   Wt 69.9 kg   LMP 06/13/2020   SpO2 100%   BMI 24.86 kg/m   Physical Exam Vitals and nursing note reviewed.  Constitutional:      Appearance: Normal appearance.  HENT:     Head: Normocephalic and atraumatic.     Nose: Nose normal.     Mouth/Throat:     Mouth: Mucous membranes are moist.  Eyes:     Extraocular Movements: Extraocular movements intact.     Conjunctiva/sclera: Conjunctivae normal.  Cardiovascular:     Rate and Rhythm: Normal rate.  Pulmonary:     Effort: Pulmonary effort is normal.     Breath sounds: Normal breath sounds.  Abdominal:     General:  Abdomen is flat.     Palpations: Abdomen is soft.     Tenderness: There is no abdominal tenderness.  Musculoskeletal:        General: No swelling. Normal range of motion.     Cervical back: Neck supple.  Skin:    General: Skin is warm and dry.     Comments: Mild erythema and induration surrounding site of tick bite. No bullseye rash  Neurological:     General: No focal deficit present.     Mental Status: She is alert.  Psychiatric:        Mood and Affect: Mood normal.      ED Results / Procedures / Treatments   Labs (all labs ordered are listed, but only abnormal results are displayed) Labs Reviewed  B. BURGDORFI ANTIBODIES  ROCKY MTN SPOTTED FVR ABS PNL(IGG+IGM)    EKG None   Radiology No results  found.  Procedures Procedures  Medications Ordered in the ED Medications - No data to display   MDM Rules/Calculators/A&P MDM RMSF and Lyme Ab tests sent at patient's request. Recommend PCP or ID follow up if positive. RTED for any fever or other concerning symptoms.  ED Course  I have reviewed the triage vital signs and the nursing notes.  Pertinent labs & imaging results that were available during my care of the patient were reviewed by me and considered in my medical decision making (see chart for details).     Final Clinical Impression(s) / ED Diagnoses Final diagnoses:  Tick bite of left thigh, initial encounter    Rx / DC Orders ED Discharge Orders    None       Pollyann Savoy, MD 07/07/20 2051

## 2020-07-07 NOTE — ED Triage Notes (Signed)
Pt states she was bitten by a tick on her L hip yesterday. Today pt reports swelling to the area. Pt requesting testing for lyme disease.

## 2020-07-10 LAB — MISC LABCORP TEST (SEND OUT): Labcorp test code: 164226

## 2020-07-10 LAB — B. BURGDORFI ANTIBODIES

## 2020-07-11 LAB — ROCKY MTN SPOTTED FVR ABS PNL(IGG+IGM)
RMSF IgG: NEGATIVE
RMSF IgM: 1 index — ABNORMAL HIGH (ref 0.00–0.89)

## 2021-03-07 DIAGNOSIS — F329 Major depressive disorder, single episode, unspecified: Secondary | ICD-10-CM | POA: Insufficient documentation

## 2021-11-06 DIAGNOSIS — Z21 Asymptomatic human immunodeficiency virus [HIV] infection status: Secondary | ICD-10-CM | POA: Insufficient documentation

## 2023-02-19 DIAGNOSIS — Z9889 Other specified postprocedural states: Secondary | ICD-10-CM | POA: Insufficient documentation

## 2023-05-29 DIAGNOSIS — F332 Major depressive disorder, recurrent severe without psychotic features: Secondary | ICD-10-CM | POA: Insufficient documentation

## 2023-09-30 ENCOUNTER — Ambulatory Visit (INDEPENDENT_AMBULATORY_CARE_PROVIDER_SITE_OTHER): Admitting: Podiatry

## 2023-09-30 ENCOUNTER — Ambulatory Visit: Admission: EM | Admit: 2023-09-30 | Discharge: 2023-09-30 | Disposition: A | Discharge status: Home / Self Care

## 2023-09-30 ENCOUNTER — Ambulatory Visit

## 2023-09-30 ENCOUNTER — Encounter: Payer: Self-pay | Admitting: Podiatry

## 2023-09-30 DIAGNOSIS — M79671 Pain in right foot: Secondary | ICD-10-CM | POA: Diagnosis not present

## 2023-09-30 DIAGNOSIS — M7751 Other enthesopathy of right foot: Secondary | ICD-10-CM

## 2023-09-30 DIAGNOSIS — F32A Depression, unspecified: Secondary | ICD-10-CM

## 2023-09-30 DIAGNOSIS — F419 Anxiety disorder, unspecified: Secondary | ICD-10-CM

## 2023-09-30 DIAGNOSIS — R600 Localized edema: Secondary | ICD-10-CM | POA: Diagnosis not present

## 2023-09-30 MED ORDER — MELOXICAM 15 MG PO TABS: 15.0000 mg | ORAL_TABLET | Freq: Every day | ORAL | 0 refills | Status: AC

## 2023-09-30 NOTE — Discharge Instructions (Signed)
 Continue following with podiatry regarding your right foot pain.  For your lower leg swelling, wear compression stockings daily until bedtime or until you are able to rest and elevate your legs, exercise regularly, avoid excess sodium, drink plenty of water.  Follow-up with your behavioral health specialist regarding your uncontrolled anxiety and depression.

## 2023-09-30 NOTE — Patient Instructions (Signed)
Ankle Sprain, Phase I Rehab An ankle sprain is an injury to the tissues that connect bone to bone (ligaments) in your ankle. Ankle sprains can cause stiffness, loss of motion, and loss of strength. Ask your health care provider which exercises are safe for you. Do exercises exactly as told by your provider and adjust them as directed. It is normal to feel mild stretching, pulling, tightness, or discomfort as you do these exercises. Stop right away if you feel sudden pain or your pain gets worse. Do not begin these exercises until told by your provider. Stretching and range-of-motion exercises These exercises warm up your muscles and joints. They can improve the movement and flexibility of your lower leg and ankle. They also help to relieve pain and stiffness. Gastroc and soleus stretch This exercise is also called a calf stretch. It stretches the muscles in the back of the lower leg. These muscles are the gastrocnemius, or gastroc, and the soleus. Sit on the floor with your left / right leg extended. Loop a belt or towel around the ball of your left / right foot. The ball of your foot is on the walking surface, right under your toes. Keep your left / right ankle and foot relaxed and keep your knee straight. Use the belt or towel to pull your foot toward you. You should feel a gentle stretch behind your calf or knee in your gastroc muscle. Hold this position for __________ seconds, then release to the starting position. Repeat the exercise with your knee bent. You can put a pillow or a rolled bath towel under your knee to support it. You should feel a stretch deep in your calf in the soleus muscle or at your Achilles tendon. Repeat __________ times. Complete this exercise __________ times a day. Ankle alphabet  Sit with your left / right leg supported at the lower leg. Do not rest your foot on anything. Make sure your foot has room to move freely. Think of your left / right foot as a  paintbrush. Move your foot to trace each letter of the alphabet in the air. Keep your hip and knee still while you trace. Make the letters as large as you can without feeling discomfort. Trace every letter from A to Z. Repeat __________ times. Complete this exercise __________ times a day. Strengthening exercises These exercises build strength and endurance in your ankle and lower leg. Endurance is the ability to use your muscles for a long time, even after they get tired. Ankle dorsiflexion  Secure a rubber exercise band or tube to an object, such as a table leg, that will stay still when the band is pulled. Secure the other end around your left / right foot. Sit on the floor facing the object, with your left / right leg extended. The band or tube should be slightly tense when your foot is relaxed. Slowly bring your foot toward you, bringing the top of your foot toward your shin (dorsiflexion), and pulling the band tighter. Hold this position for __________ seconds. Slowly return your foot to the starting position. Repeat __________ times. Complete this exercise __________ times a day. Ankle plantar flexion  Sit on the floor with your left / right leg extended. Loop a rubber exercise tube or band around the ball of your left / right foot. The ball of your foot is on the walking surface, right under your toes. Hold the ends of the band or tube in your hands. The band or tube should be   slightly tense when your foot is relaxed. Slowly point your foot and toes downward to tilt the top of your foot away from your shin (plantar flexion). Hold this position for __________ seconds. Slowly return your foot to the starting position. Repeat __________ times. Complete this exercise __________ times a day. Ankle eversion  Sit on the floor with your legs straight out in front of you. Loop a rubber exercise band or tube around the ball of your left / right foot. The ball of your foot is on the walking  surface, right under your toes. Hold the ends of the band in your hands or secure the band to a stable object. The band or tube should be slightly tense when your foot is relaxed. Slowly push your foot outward, away from your other leg (eversion). Hold this position for __________ seconds. Slowly return your foot to the starting position. Repeat __________ times. Complete this exercise __________ times a day. This information is not intended to replace advice given to you by your health care provider. Make sure you discuss any questions you have with your health care provider. Document Revised: 12/10/2021 Document Reviewed: 12/10/2021 Elsevier Patient Education  2024 [employer]  

## 2023-09-30 NOTE — ED Triage Notes (Signed)
 Pt reports depression and anxiety on going for a few months, states she has had an anxiety seizure last night while sleeping, and now has numbness in her back.
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
## 2023-10-04 NOTE — ED Provider Notes (Signed)
 [facility]    CSN: [id number] Arrival date & time: 09/30/23  1009      History   Chief Complaint No chief complaint on file.   HPI Katrina Gomez is a 33 y.o. female.   Patient presenting today with several month history of progressively worsening depression and anxiety, insomnia currently followed by behavioral health as well as right foot pain followed by podiatry, bilateral leg swelling.  Currently taking Zoloft, hydroxyzine as needed for her anxiety and depression and states this does not seem to be helping enough.  Denies chest pain, shortness of breath, palpitations, dizziness, known injury to any area.  Was very recently seen by podiatry for the right foot issue, imaging has been ordered and she plans to get this done soon and await further recommendations.  So far not trying anything over-the-counter for symptoms.    Past Medical History:  Diagnosis Date   HIV infection [facility])    Scoliosis     Patient Active Problem List   Diagnosis Date Noted   Major depressive disorder, recurrent episode, severe (HCC) 05/29/2023   History of back surgery 02/19/2023   Positive laboratory testing for human immunodeficiency virus (HCC) 11/06/2021   MDD (major depressive disorder) 03/07/2021   Scoliosis 06/20/2019   Gingivitis 04/02/2016   HIV (human immunodeficiency virus infection) (HCC) 09/13/2014   Family planning 08/16/2012    Past Surgical History:  Procedure Laterality Date   BACK SURGERY      OB History   No obstetric history on file.      Home Medications    Prior to Admission medications   Medication Sig Start Date End Date Taking? Authorizing Provider  gabapentin (NEURONTIN) 300 MG capsule Take 300 mg by mouth. 07/09/23  Yes [provider]  hydrOXYzine (ATARAX) 25 MG tablet Take 25 mg by mouth. 04/07/23  Yes [provider]  sertraline (ZOLOFT) 50 MG tablet Take 50 mg by mouth daily. 04/07/23  Yes [provider]  tiZANidine  (ZANAFLEX) 4 MG tablet Take 4 mg by mouth once. 02/19/23 02/19/24 Yes [provider]  BIKTARVY 50-200-25 MG TABS tablet Take 1 tablet by mouth daily. 03/23/18   [provider]  chlorhexidine  (PERIDEX ) 0.12 % solution Use as directed 15 mLs in the mouth or throat 2 (two) times daily. Swish and spit.  Do not swallow. 05/01/18   Jason Fish B, PA-C  letrozole (FEMARA) 2.5 MG tablet TAKE 3 TABLETS BY MOUTH ON CYCLE DAYS 3-7    [provider]  meloxicam  (MOBIC ) 15 MG tablet Take 1 tablet (15 mg total) by mouth daily. 09/30/23   Sikora, Rebecca, DPM  Norgestimate-Ethinyl Estradiol Triphasic 0.18/0.215/0.25 MG-35 MCG tablet Take 1 tablet by mouth every morning. 08/16/12   [provider]    Family History History reviewed. No pertinent family history.  Social History Social History   Tobacco Use   Smoking status: Never   Smokeless tobacco: Never  Vaping Use   Vaping status: Never Used  Substance Use Topics   Alcohol use: No   Drug use: No     Allergies   Patient has no known allergies.   Review of Systems Review of Systems Per HPI  Physical Exam Triage Vital Signs ED Triage Vitals  Encounter Vitals Group     BP 09/30/23 1047 (!) 99/58     Girls Systolic BP Percentile --      Girls Diastolic BP Percentile --      Boys Systolic BP Percentile --  Boys Diastolic BP Percentile --      Pulse Rate 09/30/23 1047 83     Resp 09/30/23 1047 18     Temp 09/30/23 1047 98.4 F (36.9 C)     Temp Source 09/30/23 1047 Oral     SpO2 09/30/23 1047 98 %     Weight --      Height --      Head Circumference --      Peak Flow --      Pain Score 09/30/23 1049 10     Pain Loc --      Pain Education --      Exclude from Growth Chart --    No data found.  Updated Vital Signs BP (!) 99/58 (BP Location: Right Arm)   Pulse 83   Temp 98.4 F (36.9 C) (Oral)   Resp 18   LMP 09/29/2023 (Exact Date)   SpO2 98%   Visual Acuity Right Eye Distance:    Left Eye Distance:   Bilateral Distance:    Right Eye Near:   Left Eye Near:    Bilateral Near:     Physical Exam Vitals and nursing note reviewed.  Constitutional:      Appearance: Normal appearance. She is not ill-appearing.  HENT:     Head: Atraumatic.  Eyes:     Extraocular Movements: Extraocular movements intact.     Conjunctiva/sclera: Conjunctivae normal.  Cardiovascular:     Rate and Rhythm: Normal rate and regular rhythm.     Heart sounds: Normal heart sounds.  Pulmonary:     Effort: Pulmonary effort is normal.     Breath sounds: Normal breath sounds.  Musculoskeletal:        General: Swelling present. No tenderness or signs of injury. Normal range of motion.     Cervical back: Normal range of motion and neck supple.     Comments: 1+ edema bilateral lower legs below the knee, symmetric.  Right dorsal foot mildly edematous, tender to palpation diffusely with no bony deformity palpable.  Range of motion intact.  Negative Homans' sign and squeeze test bilaterally  Skin:    General: Skin is warm and dry.     Findings: No bruising or erythema.  Neurological:     Mental Status: She is alert and oriented to person, place, and time.  Psychiatric:        Mood and Affect: Mood normal.        Thought Content: Thought content normal.        Judgment: Judgment normal.      UC Treatments / Results  Labs (all labs ordered are listed, but only abnormal results are displayed) Labs Reviewed - No data to display  EKG   Radiology No results found.  Procedures Procedures (including critical care time)  Medications Ordered in UC Medications - No data to display  Initial Impression / Assessment and Plan / UC Course  I have reviewed the triage vital signs and the nursing notes.  Pertinent labs & imaging results that were available during my care of the patient were reviewed by me and considered in my medical decision making (see chart for details).     Right foot  pain being actively followed by podiatry, awaiting imaging and further recommendations.  Discussed with patient will defer care to them as there are x-rays pending that she needs to obtain in order to get further recommendations from them.  Supportive over-the-counter medications and home care reviewed in the meantime.  Regarding her lower extremity edema very low suspicion for DVT as swelling is mild, symmetric bilaterally and negative T[place]a' sign or any other associated symptoms or risk factors.  Venous ultrasound deferred with shared decision making today, discussed compression stockings, elevation, exercise, fluid intake.  Regarding anxiety and depression, continue hydroxyzine as needed, Zoloft and closely follow-up with behavioral health for adjustments in medication.  Return for worsening symptoms.  45 minutes spent today in direct patient care, education, chart review  Final Clinical Impressions(s) / UC Diagnoses   Final diagnoses:  Right foot pain  Anxiety and depression  Bilateral leg edema     Discharge Instructions      Continue following with podiatry regarding your right foot pain.  For your lower leg swelling, wear compression stockings daily until bedtime or until you are able to rest and elevate your legs, exercise regularly, avoid excess sodium, drink plenty of water.  Follow-up with your behavioral health specialist regarding your uncontrolled anxiety and depression.    ED Prescriptions   None    PDMP not reviewed this encounter.   Stuart Vernell Norris, [place] 10/04/23 1520

## 2023-10-12 ENCOUNTER — Encounter: Payer: Self-pay | Admitting: Podiatry

## 2023-10-16 ENCOUNTER — Inpatient Hospital Stay: Admission: RE | Admit: 2023-10-16 | Source: Ambulatory Visit

## 2023-11-11 ENCOUNTER — Ambulatory Visit (INDEPENDENT_AMBULATORY_CARE_PROVIDER_SITE_OTHER): Admitting: Podiatry

## 2023-11-11 DIAGNOSIS — Z91199 Patient's noncompliance with other medical treatment and regimen due to unspecified reason: Secondary | ICD-10-CM

## 2023-11-11 NOTE — Progress Notes (Signed)
 No show
